# Patient Record
Sex: Female | Born: 1970 | Race: White | Hispanic: Yes | Marital: Married | Smoking: Never smoker
Health system: Southern US, Community
[De-identification: ages and names within clinical notes are randomized; demographics above are authoritative.]

## PROBLEM LIST (undated history)

## (undated) DIAGNOSIS — E0500 Thyrotoxicosis with diffuse goiter without thyrotoxic crisis or storm: Secondary | ICD-10-CM

## (undated) HISTORY — DX: Thyrotoxicosis with diffuse goiter without thyrotoxic crisis or storm: E05.00

## (undated) HISTORY — PX: NO PAST SURGERIES: SHX2092

---

## 1998-05-20 ENCOUNTER — Ambulatory Visit (HOSPITAL_COMMUNITY): Admission: RE | Admit: 1998-05-20 | Discharge: 1998-05-20 | Payer: Self-pay | Admitting: *Deleted

## 1998-10-19 ENCOUNTER — Inpatient Hospital Stay (HOSPITAL_COMMUNITY): Admission: AD | Admit: 1998-10-19 | Discharge: 1998-10-21 | Payer: Self-pay | Admitting: Obstetrics & Gynecology

## 2002-05-15 ENCOUNTER — Encounter: Payer: Self-pay | Admitting: Emergency Medicine

## 2002-05-15 ENCOUNTER — Emergency Department (HOSPITAL_COMMUNITY): Admission: EM | Admit: 2002-05-15 | Discharge: 2002-05-15 | Payer: Self-pay | Admitting: Emergency Medicine

## 2003-09-19 ENCOUNTER — Encounter: Admission: RE | Admit: 2003-09-19 | Discharge: 2003-09-19 | Payer: Self-pay | Admitting: Obstetrics and Gynecology

## 2003-09-23 ENCOUNTER — Ambulatory Visit (HOSPITAL_COMMUNITY): Admission: RE | Admit: 2003-09-23 | Discharge: 2003-09-23 | Payer: Self-pay | Admitting: Obstetrics and Gynecology

## 2007-11-22 ENCOUNTER — Ambulatory Visit: Payer: Self-pay | Admitting: Internal Medicine

## 2007-11-23 ENCOUNTER — Ambulatory Visit: Payer: Self-pay | Admitting: Family Medicine

## 2007-11-23 ENCOUNTER — Ambulatory Visit: Payer: Self-pay | Admitting: *Deleted

## 2007-11-23 LAB — CONVERTED CEMR LAB
Basophils Absolute: 0.1 10*3/uL (ref 0.0–0.1)
Basophils Relative: 1 % (ref 0–1)
Calcium: 9.1 mg/dL (ref 8.4–10.5)
Eosinophils Absolute: 0.5 10*3/uL (ref 0.0–0.7)
FSH: 2.4 milliintl units/mL
Glucose, Bld: 88 mg/dL (ref 70–99)
HDL: 50 mg/dL (ref >39)
Hemoglobin: 13.3 g/dL (ref 12.0–15.0)
Lymphocytes Relative: 35 % (ref 12–46)
Lymphs Abs: 2.7 10*3/uL (ref 0.7–4.0)
MCHC: 33.3 g/dL (ref 30.0–36.0)
MCV: 91.7 fL (ref 78.0–100.0)
Neutro Abs: 4.2 10*3/uL (ref 1.7–7.7)
Neutrophils Relative %: 53 % (ref 43–77)
Platelets: 296 10*3/uL (ref 150–400)
RDW: 14.1 % (ref 11.5–15.5)
TSH: 2.156 microintl units/mL (ref 0.350–4.50)
Total Bilirubin: 0.7 mg/dL (ref 0.3–1.2)
Total CHOL/HDL Ratio: 3.3
Total Protein: 7 g/dL (ref 6.0–8.3)
Triglycerides: 58 mg/dL (ref <150)
VLDL: 12 mg/dL (ref 0–40)
WBC: 7.9 10*3/uL (ref 4.0–10.5)

## 2007-12-11 ENCOUNTER — Encounter: Payer: Self-pay | Admitting: Family Medicine

## 2007-12-11 ENCOUNTER — Ambulatory Visit: Payer: Self-pay | Admitting: Internal Medicine

## 2007-12-11 LAB — CONVERTED CEMR LAB: Progesterone: 9.3 ng/mL

## 2007-12-20 ENCOUNTER — Ambulatory Visit: Payer: Self-pay | Admitting: Family Medicine

## 2007-12-20 ENCOUNTER — Encounter: Payer: Self-pay | Admitting: Family Medicine

## 2007-12-20 LAB — CONVERTED CEMR LAB: GC Probe Amp, Genital: NEGATIVE

## 2010-06-19 NOTE — Group Therapy Note (Signed)
NAME:  Regina Calhoun, Regina Calhoun NO.:  [id number]   MEDICAL RECORD NO.:  [id number]                   PATIENT TYPE:  OUT   LOCATION:  [facility]                           FACILITY:  [facility]   PHYSICIAN:  Argentina Donovan, MD                     DATE OF BIRTH:  [birth year]   DATE OF SERVICE:  09/19/2003                                    CLINIC NOTE   REASON FOR VISIT:  The patient is a 40 year old Hispanic female gravida 3  para 2-0-0-2 with a child age 72 and 81 with the same partner she has had  many years who desires to become pregnant and also has had a significant  problem over the past year with pelvic pain not related to her period, comes  and goes, but is also bothered by significant dyspareunia with intercourse.  Interesting family history, her mother has had surgery for urinary  incontinence and the patient has significant stress incontinence.  She has  relatively regular periods and a normal bowel movement on most days.  She is  5 feet 4 inches and weighs 152 pounds.   PHYSICAL EXAMINATION:  Her abdomen is soft, flat, nontender.  No masses, no  organomegaly.  External genitalia is normal.  BUS within normal limits.  The  vagina is clean and well rugated with a first degree cystocele and a first  degree uterine prolapse with a uterus of normal size, shape, and consistency  and a fishmouth cervix.  The adnexa could not be well outlined.  Of  significance in her past history is after her first baby she had a uterine  inversion which was done when the placenta was delivered.  They put her to  sleep and were able to reverse the problem.   On examination my impression is that the patient has dyspareunia because of  deep penetration because of her uterine prolapse and cystocele.  We have  counseled her in positional changes.  Also, we are going to get a DNA and a  wet prep to rule out any pelvic infection, an ultrasound to rule out any  significant problem we may have  missed, and given her a slip to get a sperm  analysis.  If all these things are normal, would get a hysterosalpingogram  on this patient and eventually do a laparoscopy.  I have told her it would  not be wise until she has completed her family to go through corrective  surgery for the stress incontinence because it is not a major problem in her  life at this point, and there is significant surgery entailed in correcting  that and she is best off eventually having a hysterectomy when it is  desired.   IMPRESSION:  1. Secondary infertility.  2. Symptomatic cystocele with first degree uterine prolapse and dyspareunia.  Argentina Donovan, MD    PR/MEDQ  D:  09/19/2003  T:  09/19/2003  Job:  [id number]

## 2012-02-26 ENCOUNTER — Ambulatory Visit: Payer: Self-pay | Admitting: Physician Assistant

## 2012-02-26 VITALS — BP 111/69 | HR 77 | Temp 98.0°F | Resp 16 | Ht 66.0 in | Wt 173.6 lb

## 2012-02-26 DIAGNOSIS — L299 Pruritus, unspecified: Secondary | ICD-10-CM

## 2012-02-26 MED ORDER — CETIRIZINE HCL 10 MG PO TABS: 10.0000 mg | ORAL_TABLET | Freq: Every day | ORAL | Status: DC | PRN

## 2012-02-26 MED ORDER — RANITIDINE HCL 300 MG PO TABS: 300.0000 mg | ORAL_TABLET | Freq: Every evening | ORAL | Status: DC | PRN

## 2012-02-26 NOTE — Progress Notes (Signed)
   [address] [place] [address]   Phone [phone]  Subjective:    Patient ID: Regina Calhoun, female    DOB: 1970/05/08, 42 y.o.   MRN: [id number]  HPI Pt presents to clinic with overall itching that started yesterday afternoon and has just gotten worse.  She has been exposed to nothing new and has taken no medications for the itching.  Her daughter brings up that she also has some leg pain and chest pain but the patient does not seem interested in those things being evaluated.  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  She has had for months and does not take anything for the pain.  She has a second of chest pain this am not related to anything and is not worried about it currently, no problems with heartburn, SOB or wheezing.  No h/o heart problems personal or in her family.  She does not feel like she has to burp more than normal.  Review of Systems  Constitutional: Negative for fever and chills.  Respiratory: Negative for cough and shortness of breath.   Cardiovascular: Positive for chest pain (a few seconds this am - did not happen again ). Negative for leg swelling.  Musculoskeletal: Positive for myalgias (bilateral lower legs).  Skin: Negative for rash.       Itchy without any rash       Objective:   Physical Exam  Vitals reviewed. Constitutional: She is oriented to person, place, and time. She appears well-developed and well-nourished.  HENT:  Head: Normocephalic and atraumatic.  Right Ear: External ear normal.  Left Ear: External ear normal.  Eyes: Conjunctivae normal are normal.  Cardiovascular: Normal rate, regular rhythm and normal heart sounds.   Pulmonary/Chest: Effort normal and breath sounds normal.       No pain with chest wall palpation  Abdominal: Soft. There is no tenderness.  Musculoskeletal:       No pain with calf palpation, neg homans bilaterally  Neurological: She  is alert and oriented to person, place, and time.  Skin: Skin is warm and dry. No rash noted. No erythema.       Pt scratching the entire visit.  No rash but does have areas of excoriations and petechiae where she states that she itches the most.  Psychiatric: She has a normal mood and affect. Her behavior is normal. Judgment and thought content normal.        Assessment & Plan:   1. Itching  ranitidine (ZANTAC) 300 MG tablet, cetirizine (ZYRTEC) 10 MG tablet   Unsure cause of itching but seems to be a release of histamine so will treat with blockers and pt to use benadryl tonight to help her sleep.  I think that her leg pain is from standing all day due to length of time and neg exam - pt will try OTC NSAIDs and if it gets worse pt will RTC but I think that it will not.  Pt to f/u is develops rash and to the ED is any breathing problems due to [place] not knowing what is causing her itching.  She should avoid hot showers and use moisturizer to help with dry skin.  Pt understands and agrees with the above.

## 2012-04-20 ENCOUNTER — Other Ambulatory Visit (HOSPITAL_COMMUNITY): Payer: Self-pay | Admitting: Internal Medicine

## 2012-04-20 DIAGNOSIS — Z1231 Encounter for screening mammogram for malignant neoplasm of breast: Secondary | ICD-10-CM

## 2012-04-25 ENCOUNTER — Ambulatory Visit (HOSPITAL_COMMUNITY)
Admission: RE | Admit: 2012-04-25 | Discharge: 2012-04-25 | Disposition: A | Discharge status: Home / Self Care | Payer: Self-pay | Source: Ambulatory Visit | Attending: Internal Medicine | Admitting: Internal Medicine

## 2012-04-25 DIAGNOSIS — Z1231 Encounter for screening mammogram for malignant neoplasm of breast: Secondary | ICD-10-CM | POA: Insufficient documentation

## 2012-04-27 ENCOUNTER — Other Ambulatory Visit: Payer: Self-pay | Admitting: Internal Medicine

## 2012-04-27 DIAGNOSIS — R928 Other abnormal and inconclusive findings on diagnostic imaging of breast: Secondary | ICD-10-CM

## 2012-05-12 ENCOUNTER — Other Ambulatory Visit: Payer: Self-pay | Admitting: *Deleted

## 2012-05-12 DIAGNOSIS — N6489 Other specified disorders of breast: Secondary | ICD-10-CM

## 2012-05-16 ENCOUNTER — Encounter (HOSPITAL_COMMUNITY): Payer: Self-pay

## 2012-05-16 ENCOUNTER — Ambulatory Visit (HOSPITAL_COMMUNITY)
Admission: RE | Admit: 2012-05-16 | Discharge: 2012-05-16 | Disposition: A | Discharge status: Home / Self Care | Payer: Self-pay | Source: Ambulatory Visit | Attending: Obstetrics and Gynecology | Admitting: Obstetrics and Gynecology

## 2012-05-16 VITALS — BP 110/70 | Temp 98.8°F | Ht 62.5 in | Wt 170.2 lb

## 2012-05-16 DIAGNOSIS — N644 Mastodynia: Secondary | ICD-10-CM | POA: Insufficient documentation

## 2012-05-16 DIAGNOSIS — Z01419 Encounter for gynecological examination (general) (routine) without abnormal findings: Secondary | ICD-10-CM

## 2012-05-16 NOTE — Progress Notes (Signed)
Referred to [facility] by the [facility] due to needing additional imaging of her right breast. Screening mammogram completed 3/22014 at [facility]. Complaints of bilateral outer breast tenderness that is greater in the right breast. Pain rates pain at a 2 out of 10. Patient stated the pain is dull with occasional pulsating in the right breast.  Pap Smear:  Completed Pap smear today. Per patient last Pap smear was 2 years ago in [place]. Per patient no history of an abnormal Pap smear. Pap smear 12/20/2007 is in [facility].Marland Kitchen  Physical exam: Breasts Breasts symmetrical. No skin abnormalities bilateral breasts. No nipple retraction bilateral breasts. No nipple discharge bilateral breasts. No lymphadenopathy. No lumps palpated bilateral breasts. Patient complained of bilateral outer breast tenderness on exam. Counseled patient on decreasing caffeine intake. Referred patient to the [facility] for right breast diagnostic mammogram and possible ultrasound per recommendation. Appointment scheduled for Tuesday, May 30, 2012 at 0940.         Pelvic/Bimanual   Ext Genitalia No lesions, no swelling and no discharge observed on external genitalia.         Vagina Vagina pink and normal texture. No lesions or discharge observed in vagina.          Cervix Cervix is present. Cervix pink and of normal texture. Cervix friable. No discharge observed on cervix.    Uterus Uterus is present and palpable. Uterus in normal position and normal size. Patient complained of tenderness on palpation. Referred patient to the [facility] due to patient complaining of constant lower abdominal tenderness. Appointment scheduled for Thursday, Jun 15, 2012 at 1430.       Adnexae Bilateral ovaries present and palpable. No tenderness on palpation.        Rectovaginal No rectal exam completed today since patient had no rectal complaints. No skin abnormalities observed on  exam.

## 2012-05-16 NOTE — Patient Instructions (Signed)
Taught patient how to perform BSE. Let her know [facility] will cover Pap smears every 3 years unless has a history of abnormal Pap smears. Referred patient to the [facility] for lower abdominal pain and tenderness. Appointment scheduled for Thursday, Jun 15, 2012 at 1430. Let patient know there is a $20.00 co-pay and that she completed the financial assistance paperwork today. Referred patient to the [facility] for right breast diagnostic mammogram and possible ultrasound per recommendation. Appointment scheduled for Tuesday, May 30, 2012 at 0940. Patient aware of appointments and will be there. Let patient know will follow up with her within the next couple weeks with results by letter or phone. Patient verbalized understanding.

## 2012-05-22 ENCOUNTER — Encounter (HOSPITAL_COMMUNITY): Payer: Self-pay | Admitting: *Deleted

## 2012-05-23 ENCOUNTER — Ambulatory Visit (HOSPITAL_COMMUNITY): Payer: Self-pay

## 2012-05-24 ENCOUNTER — Other Ambulatory Visit: Payer: Self-pay

## 2012-05-30 ENCOUNTER — Other Ambulatory Visit: Payer: Self-pay

## 2012-05-30 ENCOUNTER — Ambulatory Visit
Admission: RE | Admit: 2012-05-30 | Discharge: 2012-05-30 | Disposition: A | Discharge status: Home / Self Care | Payer: No Typology Code available for payment source | Source: Ambulatory Visit | Attending: Obstetrics and Gynecology | Admitting: Obstetrics and Gynecology

## 2012-05-30 DIAGNOSIS — N6489 Other specified disorders of breast: Secondary | ICD-10-CM

## 2012-06-15 ENCOUNTER — Encounter: Payer: Self-pay | Admitting: Medical

## 2013-06-12 ENCOUNTER — Ambulatory Visit: Payer: No Typology Code available for payment source

## 2013-11-06 ENCOUNTER — Ambulatory Visit: Payer: No Typology Code available for payment source | Attending: Internal Medicine

## 2013-11-15 ENCOUNTER — Encounter: Payer: Self-pay | Admitting: Internal Medicine

## 2013-11-15 ENCOUNTER — Ambulatory Visit: Payer: No Typology Code available for payment source | Attending: Internal Medicine | Admitting: Internal Medicine

## 2013-11-15 VITALS — BP 109/71 | HR 68 | Temp 97.5°F | Resp 20 | Ht 63.5 in | Wt 158.4 lb

## 2013-11-15 DIAGNOSIS — Z23 Encounter for immunization: Secondary | ICD-10-CM | POA: Insufficient documentation

## 2013-11-15 DIAGNOSIS — N912 Amenorrhea, unspecified: Secondary | ICD-10-CM | POA: Insufficient documentation

## 2013-11-15 DIAGNOSIS — N644 Mastodynia: Secondary | ICD-10-CM | POA: Insufficient documentation

## 2013-11-15 LAB — CBC
HEMATOCRIT: 39.7 % (ref 36.0–46.0)
HEMOGLOBIN: 13.8 g/dL (ref 12.0–15.0)
MCH: 28 pg (ref 26.0–34.0)
MCHC: 34.8 g/dL (ref 30.0–36.0)
MCV: 80.5 fL (ref 78.0–100.0)
Platelets: 323 10*3/uL (ref 150–400)
RBC: 4.93 MIL/uL (ref 3.87–5.11)
RDW: 13.9 % (ref 11.5–15.5)
WBC: 11.5 10*3/uL — AB (ref 4.0–10.5)

## 2013-11-15 LAB — COMPLETE METABOLIC PANEL WITH GFR
ALBUMIN: 4 g/dL (ref 3.5–5.2)
ALK PHOS: 100 U/L (ref 39–117)
ALT: 28 U/L (ref 0–35)
AST: 26 U/L (ref 0–37)
BILIRUBIN TOTAL: 0.5 mg/dL (ref 0.2–1.2)
BUN: 9 mg/dL (ref 6–23)
CALCIUM: 9.6 mg/dL (ref 8.4–10.5)
CO2: 23 mEq/L (ref 19–32)
CREATININE: 0.54 mg/dL (ref 0.50–1.10)
Chloride: 103 mEq/L (ref 96–112)
Glucose, Bld: 99 mg/dL (ref 70–99)
POTASSIUM: 4.2 meq/L (ref 3.5–5.3)
Sodium: 138 mEq/L (ref 135–145)
TOTAL PROTEIN: 6.9 g/dL (ref 6.0–8.3)

## 2013-11-15 LAB — POCT URINE PREGNANCY: Preg Test, Ur: NEGATIVE

## 2013-11-15 LAB — TSH: TSH: 0.008 u[IU]/mL — ABNORMAL LOW (ref 0.350–4.500)

## 2013-11-15 NOTE — Progress Notes (Signed)
Patient presents to establish care C/O abdomen "growing" for 2 months LMP 10/28/13 States she has taken home pregnancy test that was negative Menses in Aug 2015 only lasted 2 days; usually lasts 4 days

## 2013-11-15 NOTE — Progress Notes (Signed)
Patient ID: Regina CritchleyGabriela R Calhoun, female   DOB: 1970/12/01, 43 y.o.   MRN: [id number]   [id number]  [id number]:[id number]  DOB - 1970/12/01  CC:  Chief Complaint  Patient presents with  . Establish Care       HPI: Regina Calhoun is a 43 y.o. female here today to establish medical care.  Patient presents to clinic today with concerns of a growing abdomen over the past two months.  She reports that her last menustral period was 9/27 but was only for two days.  She notes some abdominal cramping and swelling that makes her abdomen feels hard.  She notes the distension after meals.  Denies gas or constipation.  Some breast tenderness.  Last pap smear was last year and was normal.   No Known Allergies History reviewed. No pertinent past medical history. Current Outpatient Prescriptions on File Prior to Visit  Medication Sig Dispense Refill  . multivitamin-iron-minerals-folic acid (CENTRUM) chewable tablet Chew 1 tablet by mouth daily.      . cetirizine (ZYRTEC) 10 MG tablet Take 1 tablet (10 mg total) by mouth daily as needed (itching).  15 tablet  0  . ranitidine (ZANTAC) 300 MG tablet Take 1 tablet (300 mg total) by mouth at bedtime as needed (itching).  15 tablet  0   No current facility-administered medications on file prior to visit.   Family History  Problem Relation Age of Onset  . Polycystic ovary syndrome Daughter   . Clotting disorder Paternal Grandmother   . Hypothyroidism Mother    History   Social History  . Marital Status: Married    Spouse Name: N/A    Number of Children: N/A  . Years of Education: N/A   Occupational History  . Not on file.   Social History Main Topics  . Smoking status: Never Smoker   . Smokeless tobacco: Not on file  . Alcohol Use: No  . Drug Use: No  . Sexual Activity: Not Currently   Other Topics Concern  . Not on file   Social History Narrative  . No narrative on file    Review of Systems: See HPI.    Objective:   Filed Vitals:   11/15/13 1419  BP: 109/71  Pulse: 68  Temp: 97.5 F (36.4 C)  Resp: 20    Physical Exam: Constitutional: Patient appears well-developed and well-nourished. No distress. HENT: Normocephalic, atraumatic, External right and left ear normal. Oropharynx is clear and moist.  Eyes: Conjunctivae and EOM are normal. PERRLA, no scleral icterus. Neck: Normal ROM. Neck supple. No JVD. No tracheal deviation. No thyromegaly. CVS: RRR, S1/S2 +, no murmurs, no gallops, no carotid bruit.  Pulmonary: Effort and breath sounds normal, no stridor, rhonchi, wheezes, rales.  Abdominal: Soft. BS +, no distension, tenderness, rebound or guarding.  Musculoskeletal: Normal range of motion. No edema and no tenderness.  Lymphadenopathy: No lymphadenopathy noted, cervical, Neuro: Alert. Normal reflexes, muscle tone coordination. No cranial nerve deficit. Skin: Skin is warm and dry. No rash noted. Not diaphoretic. No erythema. No pallor. Psychiatric: Normal mood and affect. Behavior, judgment, thought content normal.  Lab Results  Component Value Date   WBC 7.9 11/23/2007   HGB 13.3 11/23/2007   HCT 39.9 11/23/2007   MCV 91.7 11/23/2007   PLT 296 11/23/2007   Lab Results  Component Value Date   CREATININE 0.55 11/23/2007   BUN 8 11/23/2007   NA 140 11/23/2007   K 4.3 11/23/2007   CL 108 11/23/2007   CO2 23  11/23/2007    No results found for this basename: HGBA1C   Lipid Panel     Component Value Date/Time   CHOL 166 11/23/2007 2101   TRIG 58 11/23/2007 2101   HDL 50 11/23/2007 2101   CHOLHDL 3.3 Ratio 11/23/2007 2101   VLDL 12 11/23/2007 2101   LDLCALC 104* 11/23/2007 2101       Assessment and plan:   Regina Calhoun was seen today for establish care.  Diagnoses and associated orders for this visit:  Amenorrhea - POCT urine pregnancy Distended abdomen unnoticeable to me  Breast tenderness - TSH - CBC - COMPLETE METABOLIC PANEL WITH GFR  Need for prophylactic vaccination and inoculation  against influenza - Flu Vaccine QUAD 36+ mos PF IM (Fluarix Quad PF)   Due to language barrier, an interpreter was present during the history-taking and subsequent discussion (and for part of the physical exam) with this patient.   Return if symptoms worsen or fail to improve.  The patient was given clear instructions to go to ER or return to medical center if symptoms don't improve, worsen or new problems develop. The patient verbalized understanding.   Holland CommonsKECK, VALERIE, NP-C [facility] [phone] 11/18/2013, 11:22 PM

## 2013-11-26 ENCOUNTER — Telehealth: Payer: Self-pay | Admitting: Internal Medicine

## 2013-11-26 ENCOUNTER — Telehealth: Payer: Self-pay | Admitting: *Deleted

## 2013-11-26 NOTE — Telephone Encounter (Signed)
Pt. Came into facility requesting blood work results. Pt. States that she is not feeling well. Please f/u with pt.

## 2013-11-26 NOTE — Telephone Encounter (Signed)
Message copied by Dyann KiefGIRALDEZ, Trapper Meech M on Mon Nov 26, 2013  2:56 PM ------      Message from: Holland CommonsKECK, VALERIE A      Created: Wed Nov 21, 2013  6:34 PM       Please call patient and let her know her thyroid level was reading too low. Please have patient come back in 2 weeks for repeat TSH and free T4. Find out if patient doesn't have any symptoms relating to overactive thyroid ------

## 2013-11-26 NOTE — Telephone Encounter (Signed)
Pt aware of lab results, call transfered to front office to schedule lab appointment

## 2013-11-30 ENCOUNTER — Ambulatory Visit: Payer: No Typology Code available for payment source | Attending: Internal Medicine

## 2013-11-30 DIAGNOSIS — R7989 Other specified abnormal findings of blood chemistry: Secondary | ICD-10-CM

## 2013-12-01 LAB — T4, FREE: Free T4: 2.01 ng/dL — ABNORMAL HIGH (ref 0.80–1.80)

## 2013-12-01 LAB — TSH: TSH: 0.01 u[IU]/mL — ABNORMAL LOW (ref 0.350–4.500)

## 2013-12-03 ENCOUNTER — Encounter: Payer: Self-pay | Admitting: Internal Medicine

## 2013-12-05 ENCOUNTER — Telehealth: Payer: Self-pay | Admitting: Emergency Medicine

## 2013-12-05 NOTE — Telephone Encounter (Signed)
-----   Message from Ambrose FinlandValerie A Keck, NP sent at 12/04/2013  9:29 PM EST ----- Patient has overactive thyroid. Please find out if patient is having symptoms and send referral to endocrinology please

## 2013-12-12 ENCOUNTER — Telehealth: Payer: Self-pay | Admitting: *Deleted

## 2013-12-12 NOTE — Telephone Encounter (Signed)
-----   Message from Valerie A Keck, NP sent at 12/04/2013  9:29 PM EST ----- Patient has overactive thyroid. Please find out if patient is having symptoms and send referral to endocrinology please 

## 2013-12-12 NOTE — Telephone Encounter (Signed)
Unable to contact Patient, no answer machine

## 2013-12-21 ENCOUNTER — Encounter: Payer: Self-pay | Admitting: *Deleted

## 2013-12-21 DIAGNOSIS — E079 Disorder of thyroid, unspecified: Secondary | ICD-10-CM

## 2014-01-17 ENCOUNTER — Ambulatory Visit (INDEPENDENT_AMBULATORY_CARE_PROVIDER_SITE_OTHER): Payer: Self-pay | Admitting: Internal Medicine

## 2014-01-17 ENCOUNTER — Encounter: Payer: Self-pay | Admitting: Internal Medicine

## 2014-01-17 VITALS — BP 104/62 | HR 77 | Temp 98.6°F | Resp 12 | Ht 63.0 in | Wt 165.6 lb

## 2014-01-17 DIAGNOSIS — E0590 Thyrotoxicosis, unspecified without thyrotoxic crisis or storm: Secondary | ICD-10-CM

## 2014-01-17 NOTE — Progress Notes (Signed)
Patient ID: Regina Calhoun, female   DOB: November 27, 1970, 43 y.o.   MRN: [id number]   HPI  Regina Calhoun is a 43 y.o.-year-old female, referred by her PCP, Dr. Luna GlasgowKeck, for evaluation for thyrotoxicosis. Patient is seen with the help of the Spanish interpreter.  She does not have insurance (has [employer] Card)!  She was found to have overactive thyroid at last visit with PCP. They were checked b/c she c/o:  - bloating - irregular menses - skipping 1 mo starting in Aug >> she thought she was pregnant >> UPT neg.  I reviewed pt's thyroid tests: Lab Results  Component Value Date   TSH 0.010* 11/30/2013   TSH 0.008* 11/15/2013   TSH 2.156 11/23/2007   FREET4 2.01* 11/30/2013    Pt denies feeling nodules in neck, hoarseness, dysphagia/odynophagia, SOB with lying down; she c/o: - no fatigue - no excessive sweating/heat intolerance - no tremors - no anxiety - no palpitations - no hyperdefecation - no weight loss; + weight gain 4 lbs in 2 mo - + hair loss - + chest pain - stabbing, not related to I/E  Pt have a FH of thyroid ds.: mother, sister, brother. No FH of thyroid cancer. No h/o radiation tx to head or neck.  No seaweed or kelp, no recent contrast studies. No steroid use. No herbal supplements.  ROS: Constitutional: see HPI Eyes:+ blurry vision, no xerophthalmia ENT: no sore throat, no nodules palpated in throat, no dysphagia/odynophagia, no hoarseness Cardiovascular: + CP/no SOB/palpitations/+ leg swelling Respiratory: no cough/SOB Gastrointestinal: no N/V/D/C Musculoskeletal: no muscle/joint aches Skin: no rashes, + hair loss, + easy bruising Neurological: no tremors/numbness/tingling/dizziness Psychiatric: no depression/anxiety  No past medical history - other than described above.  No past surgical history.   History   Social History  . Marital Status: Married    Spouse Name: N/A    Number of Children: 2   Occupational History  . cleaning   Social History  Main Topics  . Smoking status: Never Smoker   . Smokeless tobacco: Not on file  . Alcohol Use: No  . Drug Use: No   Current Outpatient Prescriptions on File Prior to Visit  Medication Sig Dispense Refill  . cetirizine (ZYRTEC) 10 MG tablet Take 1 tablet (10 mg total) by mouth daily as needed (itching). (Patient not taking: Reported on 01/17/2014) 15 tablet 0  . multivitamin-iron-minerals-folic acid (CENTRUM) chewable tablet Chew 1 tablet by mouth daily.    . ranitidine (ZANTAC) 300 MG tablet Take 1 tablet (300 mg total) by mouth at bedtime as needed (itching). (Patient not taking: Reported on 01/17/2014) 15 tablet 0   No current facility-administered medications on file prior to visit.   No Known Allergies Family History  Problem Relation Age of Onset  . Polycystic ovary syndrome Daughter   . Clotting disorder Paternal Grandmother   . Hypothyroidism Mother    PE: BP 104/62 mmHg  Pulse 77  Temp(Src) 98.6 F (37 C) (Oral)  Resp 12  Ht 5\' 3"  (1.6 m)  Wt 165 lb 9.6 oz (75.116 kg)  BMI 29.34 kg/m2  SpO2 97% Wt Readings from Last 3 Encounters:  01/17/14 165 lb 9.6 oz (75.116 kg)  11/15/13 158 lb 6.4 oz (71.85 kg)  05/16/12 170 lb 3.2 oz (77.202 kg)   Constitutional: overweight, in NAD Eyes: PERRLA, EOMI, no exophthalmos, no lid lag, no stare ENT: moist mucous membranes, no thyromegaly, no thyroid bruits, no cervical lymphadenopathy Cardiovascular: RRR, No MRG Respiratory: CTA B Gastrointestinal:  abdomen soft, NT, ND, BS+ Musculoskeletal: no deformities, strength intact in all 4 Skin: moist, warm, no rashes Neurological: Fine tremor with outstretched hands, DTR normal in all 4  ASSESSMENT: 1. Thyrotoxicosis  PLAN:  1. Patient with a recently found low TSH, without thyrotoxic sxs or symptoms: weight loss, heat intolerance, hyperdefecation, palpitations, anxiety, tachycardia, but with mild tremor with outstretched hands on physical exam.  - she does not appear to have  exogenous causes for the low TSH.  - We discussed that possible causes of thyrotoxicosis are:  Graves ds   Thyroiditis toxic multinodular goiter/ toxic adenoma (I cannot feel nodules at palpation of her thyroid). - I suggested that we check the TSH, fT3 and fT4 and also thyroid stimulating antibodies to screen for Graves' disease.  - If the tests remain abnormal, we may need an uptake and scan to differentiate between the 3 above possible etiologies. However, the fact that she does not have insurance will limit our capability to perform this test. If the TSI antibodies are elevated, I will assume that she has Graves ds. and will skip the uptake and scan for now - we discussed about possible modalities of treatment for the above conditions, to include methimazole use, radioactive iodine ablation or (last resort) surgery. As mentioned above, since she does not have insurance, it is going to be difficult to perform radioactive iodine ablation, so we'll try to manage this with methimazole if it turns out to be Graves' disease. Alternatively, if this is subacute thyroiditis, we might not need to treat her but just follow her clinically and follow her thyroid tests over time. - I do not feel that we need to add beta blockers at this time, since she is not tachycardic, anxious, or tremulous - RTC in 3 months, but likely sooner for repeat labs  Per patient's preference, communication should be through the Spanish interpreter line.  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 >> Will start low dose MMI: 5 mg bid >> recheck TFTs in 1 mo. Will advise her of possible SEs.

## 2014-01-17 NOTE — Patient Instructions (Signed)
Please stop at the lab.  Please come back for a follow-up appointment in 3 months.  Hipertiroidismo (Hyperthyroidism) La tiroides es una glndula grande ubicada en la parte anterior e inferior del cuello. La tiroides interviene PepsiCoen el control del metabolismo. El metabolismo es el modo en que el organismo utiliza los alimentos. El control del metabolismo se realiza a travs de una hormona denominada tiroxina. Cuando la tiroides es hiperactiva, produce demasiada hormona. Cuando esto ocurre pueden surgir los siguientes problemas:   Nerviosismo  Intolerancia al calor  Prdida de peso (a pesar del aumento de la ingesta de comida)  Diarrea  Cambios en la textura del cabello o de la piel  Palpitaciones (falta de algunos latidos cardacos o latidos extra)  Taquicardia (frecuencia cardaca acelerada)  Falta de menstruacin (amenorrea)  Temblor en las manos CAUSAS  Enfermedad de Graves (el sistema inmune ataca a la glndula tiroides). sta es la causa ms frecuente.  Inflamacin de la glndula tiroides.  Tumor (generalmente benigno) de la glndula tiroides o Scientist, water qualityubicado en otro lugar.  Uso excesivo de medicamentos para la tiroides (tanto prescriptos como 'naturales')  Ingesta excesiva de iodo. DIAGNSTICO Para confirmar el hipertiroidismo, el profesional que lo asiste le solicitar anlisis de sangre y estudios con Tomalesultrasonido. Algunas veces los signos estn ocultos. Puede ser BB&T Corporationnecesario que el profesional controle la enfermedad con exmenes de [place]re, ya sea antes o despus del diagnstico y Glasgowtratamiento. TRATAMIENTO Tratamiento a Web designercorto plazo Existen varios tratamientos para Risk managercontrolar los sntomas. Los medicamentos beta bloqueantes podrn proporcionar cierto alivio. Los medicamentos que disminuyen la produccin de hormonas proporcionarn a Neurosurgeonmuchas personas un alivio temporal Estas medidas en general no ofrecen Architectuna mejora permanente. Tratamiento definitivo  Se dispone de varios tratamientos  que el profesional que lo asiste podr Agricultural engineercomentar con usted y que tratarn el problema de [place]era permanente. Estos tratamientos varan desde la ciruga (extirpacin de la tiroides) o el uso de yodo radiactivo (que destruye la tiroides por radiacin), hasta el uso de medicamentos antitiroideos (que interfieren en la sntesis de la hormona). Los dos primeros tratamientos son permanentes y Soil scientistgeneralmente exitosos. Habitualmente requieren el suministro de hormona de por vida. Esto es debido a que es imposible retirar o destruir la cantidad Secondary school teacherexacta de tiroides que se necesita para que la persona quede eutiroidea (normal). INSTRUCCIONES PARA EL CUIDADO DOMICILIARIO Consulte con el profesional que lo asiste si el problema por el que lo trata empeora. Algunos ejemplos seran los trastornos ya mencionados. SOLICITE ATENCIN MDICA SI: El trastorno general empeora. EST SEGURO QUE:   Comprende las instrucciones para el alta mdica.  Controlar su enfermedad.  Solicitar atencin mdica de inmediato segn las indicaciones. Document Released: 01/18/2005 Document Revised: 04/12/2011 [facility] Patient Information 2015 [employer], [place]. This information is not intended to replace advice given to you by your health care provider. Make sure you discuss any questions you have with your health care provider.

## 2014-01-21 ENCOUNTER — Other Ambulatory Visit: Payer: No Typology Code available for payment source

## 2014-01-22 LAB — T3, FREE: T3 FREE: 5.3 pg/mL — AB (ref 2.3–4.2)

## 2014-01-22 LAB — TSH: TSH: 0.008 u[IU]/mL — ABNORMAL LOW (ref 0.350–4.500)

## 2014-01-22 LAB — T4, FREE: FREE T4: 2.04 ng/dL — AB (ref 0.80–1.80)

## 2014-01-23 LAB — THYROID STIMULATING IMMUNOGLOBULIN: TSI: 341 % baseline — ABNORMAL HIGH (ref <140)

## 2014-01-23 MED ORDER — METHIMAZOLE 5 MG PO TABS: 5.0000 mg | ORAL_TABLET | Freq: Two times a day (BID) | ORAL | Status: DC

## 2014-02-26 ENCOUNTER — Other Ambulatory Visit (INDEPENDENT_AMBULATORY_CARE_PROVIDER_SITE_OTHER): Payer: Self-pay

## 2014-02-26 DIAGNOSIS — E0590 Thyrotoxicosis, unspecified without thyrotoxic crisis or storm: Secondary | ICD-10-CM

## 2014-02-26 LAB — T3, FREE: T3 FREE: 3 pg/mL (ref 2.3–4.2)

## 2014-02-26 LAB — TSH: TSH: 0.09 u[IU]/mL — AB (ref 0.35–4.50)

## 2014-02-26 LAB — T4, FREE: Free T4: 0.84 ng/dL (ref 0.60–1.60)

## 2014-05-02 ENCOUNTER — Encounter: Payer: Self-pay | Admitting: Internal Medicine

## 2014-05-02 ENCOUNTER — Ambulatory Visit: Payer: Self-pay | Attending: Internal Medicine | Admitting: Internal Medicine

## 2014-05-02 VITALS — BP 107/71 | HR 57 | Temp 97.9°F | Resp 16 | Ht 62.0 in | Wt 170.0 lb

## 2014-05-02 DIAGNOSIS — E0590 Thyrotoxicosis, unspecified without thyrotoxic crisis or storm: Secondary | ICD-10-CM | POA: Insufficient documentation

## 2014-05-02 DIAGNOSIS — E079 Disorder of thyroid, unspecified: Secondary | ICD-10-CM

## 2014-05-02 DIAGNOSIS — R350 Frequency of micturition: Secondary | ICD-10-CM | POA: Insufficient documentation

## 2014-05-02 DIAGNOSIS — N3000 Acute cystitis without hematuria: Secondary | ICD-10-CM | POA: Insufficient documentation

## 2014-05-02 LAB — POCT URINALYSIS DIPSTICK
Bilirubin, UA: NEGATIVE
GLUCOSE UA: NEGATIVE
KETONES UA: NEGATIVE
NITRITE UA: NEGATIVE
Protein, UA: NEGATIVE
RBC UA: NEGATIVE
Spec Grav, UA: 1.015
Urobilinogen, UA: 0.2
pH, UA: 7

## 2014-05-02 MED ORDER — CIPROFLOXACIN HCL 500 MG PO TABS: 500.0000 mg | ORAL_TABLET | Freq: Two times a day (BID) | ORAL | Status: DC

## 2014-05-02 MED ORDER — METHIMAZOLE 5 MG PO TABS: 5.0000 mg | ORAL_TABLET | Freq: Two times a day (BID) | ORAL | Status: DC

## 2014-05-02 NOTE — Patient Instructions (Signed)
Infección urinaria  °(Urinary Tract Infection) ° La infección urinaria puede ocurrir en cualquier lugar del tracto urinario. El tracto urinario es un sistema de drenaje del cuerpo por el que se eliminan los desechos y el exceso de agua. El tracto urinario está formado por dos riñones, dos uréteres, la vejiga y la uretra. Los riñones son órganos que tienen forma de frijol. Cada riñón tiene aproximadamente el tamaño del puño. Están situados debajo de las costillas, uno a cada lado de la columna vertebral °CAUSAS  °La causa de la infección son los microbios, que son organismos microscópicos, que incluyen hongos, virus, y bacterias. Estos organismos son tan pequeños que sólo pueden verse a través del microscopio. Las bacterias son los microorganismos que más comúnmente causan infecciones urinarias.  °SÍNTOMAS  °Los síntomas pueden variar según la edad y el sexo del paciente y por la ubicación de la infección. Los síntomas en las mujeres jóvenes incluyen la necesidad frecuente e intensa de orinar y una sensación dolorosa de ardor en la vejiga o en la uretra durante la micción. Las mujeres y los hombres mayores podrán sentir cansancio, temblores y debilidad y sentir dolores musculares y dolor abdominal. Si tiene fiebre, puede significar que la infección está en los riñones. Otros síntomas son dolor en la espalda o en los lados debajo de las costillas, náuseas y vómitos.  °DIAGNÓSTICO  °Para diagnosticar una infección urinaria, el médico le preguntará acerca de sus síntomas. También le solicitará una muestra de orina. La muestra de orina se analiza para detectar bacterias y glóbulos blancos de la sangre. Los glóbulos blancos se forman en el organismo para ayudar a combatir las infecciones.  °TRATAMIENTO  °Por lo general, las infecciones urinarias pueden tratarse con medicamentos. Debido a que la mayoría de las infecciones son causadas por bacterias, por lo general pueden tratarse con antibióticos. La elección del  antibiótico y la duración del tratamiento dependerá de sus síntomas y el tipo de bacteria causante de la infección.  °INSTRUCCIONES PARA EL CUIDADO EN EL HOGAR  °· Si le recetaron antibióticos, tómelos exactamente como su médico le indique. Termine el medicamento aunque se sienta mejor después de haber tomado sólo algunos. °· Beba gran cantidad de líquido para mantener la orina de tono claro o color amarillo pálido. °· Evite la cafeína, el té y las bebidas gaseosas. Estas sustancias irritan la vejiga. °· Vaciar la vejiga con frecuencia. Evite retener la orina durante largos períodos. °· Vacíe la vejiga antes y después de tener relaciones sexuales. °· Después de mover el intestino, las mujeres deben higienizarse la región perineal desde adelante hacia atrás. Use sólo un papel tissue por vez. °SOLICITE ATENCIÓN MÉDICA SI:  °· Siente dolor en la espalda. °· Le sube la fiebre. °· Los síntomas no mejoran luego de 3 días. °SOLICITE ATENCIÓN MÉDICA DE INMEDIATO SI:  °· Siente dolor intenso en la espalda o en la zona inferior del abdomen. °· Comienza a sentir escalofríos. °· Tiene náuseas o vómitos. °· Tiene una sensación continua de quemazón o molestias al orinar. °ASEGÚRESE DE QUE:  °· Comprende estas instrucciones. °· Controlará su enfermedad. °· Solicitará ayuda de inmediato si no mejora o empeora. °Document Released: 10/28/2004 Document Revised: 10/13/2011 °Exit[employer]® Patient Information ©2015 [employer]. This information is not intended to replace advice given to you by your health care provider. Make sure you discuss any questions you have with your health care provider. ° °

## 2014-05-02 NOTE — Progress Notes (Signed)
Patient ID: Regina CritchleyGabriela R Calhoun, female   DOB: 01/25/1971, 44 y.o.   MRN: [id number]  CC: frequent urination, thyroid  HPI: Regina Calhoun is a 44 y.o. female here today for a follow up visit.  Patient has past medical history of thyroid disease. She reports that she was unsure if she was suppose to continue care with the endocrinologist. She has been out of her tapazole for 2 days.  She has been having lower abdominal pain and back pain for the past 1 month. She denies dysuria, urinary frequency, fever, chills, of hematuria.  She also denies any vaginal discharge, itch, or odor.  LMP was 04/07/14.    No Known Allergies History reviewed. No pertinent past medical history. Current Outpatient Prescriptions on File Prior to Visit  Medication Sig Dispense Refill  . methimazole (TAPAZOLE) 5 MG tablet Take 1 tablet (5 mg total) by mouth 2 (two) times daily. 60 tablet 2  . cetirizine (ZYRTEC) 10 MG tablet Take 1 tablet (10 mg total) by mouth daily as needed (itching). (Patient not taking: Reported on 01/17/2014) 15 tablet 0  . multivitamin-iron-minerals-folic acid (CENTRUM) chewable tablet Chew 1 tablet by mouth daily.    . ranitidine (ZANTAC) 300 MG tablet Take 1 tablet (300 mg total) by mouth at bedtime as needed (itching). (Patient not taking: Reported on 01/17/2014) 15 tablet 0   No current facility-administered medications on file prior to visit.   Family History  Problem Relation Age of Onset  . Polycystic ovary syndrome Daughter   . Clotting disorder Paternal Grandmother   . Hypothyroidism Mother    History   Social History  . Marital Status: Married    Spouse Name: N/A  . Number of Children: N/A  . Years of Education: N/A   Occupational History  . Not on file.   Social History Main Topics  . Smoking status: Never Smoker   . Smokeless tobacco: Not on file  . Alcohol Use: No  . Drug Use: No  . Sexual Activity: Not Currently   Other Topics Concern  . Not on file   Social  History Narrative    Review of Systems: Constitutional: Negative for fever, chills, diaphoresis, activity change, appetite change and fatigue. HENT: Negative for ear pain, nosebleeds, congestion, facial swelling, rhinorrhea, neck pain, neck stiffness and ear discharge.  Eyes: Negative for pain, discharge, redness, itching and visual disturbance. Respiratory: Negative for cough, choking, chest tightness, shortness of breath, wheezing and stridor.  Cardiovascular: Negative for chest pain, palpitations and leg swelling. Gastrointestinal: Negative for abdominal distention. Genitourinary: Negative for dysuria, urgency, hematuria, flank pain, decreased urine volume, difficulty urinating and dyspareunia.  Musculoskeletal: Negative for back pain, joint swelling, arthralgias and gait problem. Neurological: Negative for dizziness, tremors, seizures, syncope, facial asymmetry, speech difficulty, weakness, light-headedness, numbness and headaches.  Hematological: Negative for adenopathy. Does not bruise/bleed easily. Psychiatric/Behavioral: Negative for hallucinations, behavioral problems, confusion, dysphoric mood, decreased concentration and agitation.    Objective:   Filed Vitals:   05/02/14 1253  BP: 107/71  Pulse: 57  Temp: 97.9 F (36.6 C)  Resp: 16    Physical Exam  Neck: No thyromegaly present.  Cardiovascular: Normal rate, regular rhythm and normal heart sounds.   Pulmonary/Chest: Effort normal and breath sounds normal.  Musculoskeletal:  No CVA tenderness      Lab Results  Component Value Date   WBC 11.5* 11/15/2013   HGB 13.8 11/15/2013   HCT 39.7 11/15/2013   MCV 80.5 11/15/2013   PLT 323 11/15/2013  Lab Results  Component Value Date   CREATININE 0.54 11/15/2013   BUN 9 11/15/2013   NA 138 11/15/2013   K 4.2 11/15/2013   CL 103 11/15/2013   CO2 23 11/15/2013    No results found for: HGBA1C Lipid Panel     Component Value Date/Time   CHOL 166 11/23/2007 2101    TRIG 58 11/23/2007 2101   HDL 50 11/23/2007 2101   CHOLHDL 3.3 Ratio 11/23/2007 2101   VLDL 12 11/23/2007 2101   LDLCALC 104* 11/23/2007 2101       Assessment and plan:   Regina Calhoun was seen today for follow-up, hypothyroidism, medication refill and abdominal pain.  Diagnoses and all orders for this visit:  Frequent urination Orders: -     Urinalysis Dipstick  Acute cystitis without hematuria Orders: -     ciprofloxacin (CIPRO) 500 MG tablet; Take 1 tablet (500 mg total) by mouth 2 (two) times daily. Explain preventative measures she may take to avoid infection  Thyroid disease Orders: -     methimazole (TAPAZOLE) 5 MG tablet; Take 1 tablet (5 mg total) by mouth 2 (two) times daily. Explained to patient that endocrinology will manage her thyroid disease. I helped patient to make appt before leaving clinic.   Interpreter was used to communicate directly with patient for the entire encounter including providing detailed patient instructions.  Return if symptoms worsen or fail to improve.      Holland Commons, NP-C [facility] [phone] 05/02/2014, 1:09 PM

## 2014-05-02 NOTE — Progress Notes (Signed)
Pt comes in today for medication refill on Thyroid medication with management States she wants to be managed here for thyroid treatment Ran out of medication last week Denies fatigue, dizziness or headaches C/o lower back pain radiating to lower abdomen Denies vaginal d/c or burn with urination LMP- 04/07/14 Urine dipstick obtained

## 2014-06-07 ENCOUNTER — Other Ambulatory Visit: Payer: Self-pay | Admitting: Internal Medicine

## 2014-06-07 ENCOUNTER — Ambulatory Visit (INDEPENDENT_AMBULATORY_CARE_PROVIDER_SITE_OTHER): Payer: Self-pay | Admitting: Internal Medicine

## 2014-06-07 ENCOUNTER — Encounter: Payer: Self-pay | Admitting: Internal Medicine

## 2014-06-07 VITALS — BP 102/64 | HR 62 | Temp 97.9°F | Resp 12 | Wt 173.0 lb

## 2014-06-07 DIAGNOSIS — E0500 Thyrotoxicosis with diffuse goiter without thyrotoxic crisis or storm: Secondary | ICD-10-CM

## 2014-06-07 DIAGNOSIS — E079 Disorder of thyroid, unspecified: Secondary | ICD-10-CM

## 2014-06-07 LAB — TSH: TSH: 2.53 u[IU]/mL (ref 0.35–4.50)

## 2014-06-07 LAB — T3, FREE: T3, Free: 3 pg/mL (ref 2.3–4.2)

## 2014-06-07 LAB — T4, FREE: Free T4: 0.73 ng/dL (ref 0.60–1.60)

## 2014-06-07 NOTE — Progress Notes (Signed)
Patient ID: Regina Calhoun, female   DOB: [birth year], 44 y.o.   MRN: [id number]   HPI  Regina Calhoun is a 44 y.o.-year-old female, returning for f/u for thyrotoxicosis (likely Graves ds.). Last visit 5 mo ago. Patient is seen with the help of the Spanish interpreter. She does not have insurance (has [employer])!  Reviewed hx: She was found to have overactive thyroid at a visit with PCP. They were checked b/c she c/o:  - bloating - irregular menses - skipping 1 mo starting in Aug >> she thought she was pregnant >> UPT neg.  I reviewed pt's thyroid tests: Lab Results  Component Value Date   TSH 0.09* 02/26/2014   TSH 0.008* 01/21/2014   TSH 0.010* 11/30/2013   TSH 0.008* 11/15/2013   TSH 2.156 11/23/2007   FREET4 0.84 02/26/2014   FREET4 2.04* 01/21/2014   FREET4 2.01* 11/30/2013    At last visit, we started MMI 5 mg bid as the suspicion for Graves ds was high. We did not do a thyroid uptake and scan due to price (no insurance). Subsequent TFTs have improved (see above). We continued the same dose of MMI.  Pt denies feeling nodules in neck, hoarseness, + dysphagia (not new)/no odynophagia, SOB with lying down.  She denies: - no fatigue - no excessive sweating/heat intolerance - sometimes tremors - no anxiety - no palpitations - no hyperdefecation - no weight loss; + weight gain 7 lbs since last visit - no hair loss - regular menses now  ROS: Constitutional: see HPI, + poor sleep Eyes:+ blurry vision, no xerophthalmia ENT: no sore throat, no nodules palpated in throat, + dysphagia/no odynophagia, no hoarseness, + tinnitus Cardiovascular: + CP - sharp, not related to I/E/no SOB/palpitations/leg swelling Respiratory: no cough/SOB Gastrointestinal: no N/V/D/C Musculoskeletal: no muscle/joint aches Skin: no rashes, no hair loss Neurological: no tremors/numbness/tingling/dizziness   I reviewed pt's medications, allergies, PMH, social hx, family hx, and changes were  documented in the history of present illness. Otherwise, unchanged from my initial visit note:  No past medical history - other than described above.  No past surgical history.   History   Social History  . Marital Status: Married    Spouse Name: N/A    Number of Children: 2   Occupational History  . cleaning   Social History Main Topics  . Smoking status: Never Smoker   . Smokeless tobacco: Not on file  . Alcohol Use: No  . Drug Use: No   Current Outpatient Prescriptions on File Prior to Visit  Medication Sig Dispense Refill  . cetirizine (ZYRTEC) 10 MG tablet Take 1 tablet (10 mg total) by mouth daily as needed (itching). (Patient not taking: Reported on 01/17/2014) 15 tablet 0  . ciprofloxacin (CIPRO) 500 MG tablet Take 1 tablet (500 mg total) by mouth 2 (two) times daily. (Patient not taking: Reported on 06/07/2014) 6 tablet 0  . methimazole (TAPAZOLE) 5 MG tablet Take 1 tablet (5 mg total) by mouth 2 (two) times daily. 60 tablet 0  . multivitamin-iron-minerals-folic acid (CENTRUM) chewable tablet Chew 1 tablet by mouth daily.    . ranitidine (ZANTAC) 300 MG tablet Take 1 tablet (300 mg total) by mouth at bedtime as needed (itching). (Patient not taking: Reported on 01/17/2014) 15 tablet 0   No current facility-administered medications on file prior to visit.   No Known Allergies Family History  Problem Relation Age of Onset  . Polycystic ovary syndrome Daughter   . Clotting disorder Paternal  Grandmother   . Hypothyroidism Mother    PE: BP 102/64 mmHg  Pulse 62  Temp(Src) 97.9 F (36.6 C) (Oral)  Resp 12  Wt 173 lb (78.472 kg)  SpO2 96% Body mass index is 31.63 kg/(m^2).  Wt Readings from Last 3 Encounters:  06/07/14 173 lb (78.472 kg)  05/02/14 170 lb (77.111 kg)  01/17/14 165 lb 9.6 oz (75.116 kg)   Constitutional: overweight, in NAD Eyes: PERRLA, EOMI, no exophthalmos ENT: moist mucous membranes, + mild symmetric, non-nodular,  thyromegaly, no cervical  lymphadenopathy Cardiovascular: RRR, No MRG Respiratory: CTA B Gastrointestinal: abdomen soft, NT, ND, BS+ Musculoskeletal: no deformities, strength intact in all 4 Skin: moist, warm, no rashes Neurological: Fine tremor with outstretched hands, DTR normal in all 4  ASSESSMENT: 1. 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  PLAN:  1. Patient with a low TSH, but without thyrotoxic sxs or symptoms: no weight loss, heat intolerance, hyperdefecation, palpitations, anxiety, tachycardia, but with mild hand tremor on physical exam.  - the fact that she does not have insurance limits our capability to perform this test, but I suspect Graves ds. Based on the hx and the absence of nodularity on thyroid exam. Also, she responded very well to MMI. - I suggested that we check the TSH, fT3 and fT4 and also thyroid stimulating antibodies to screen for Graves' disease.  - for now will continue MMI 5 mg bid (no side effects) and will change the dose when labs are back - I do not feel that we need to add beta blockers at this time, since she is not tachycardic, anxious, or tremulous - regarding her CP >> I advised her to call and d/w PCP - RTC in 6 months, but in 6 weeks for repeat labs - needs MMI refilled when labs are back  Per patient's preference, communication should be through the Spanish interpreter line.  Component     Latest Ref Rng 06/07/2014  TSH     0.35 - 4.50 uIU/mL 2.53  Free T4     0.60 - 1.60 ng/dL 1.610.73  T3, Free     2.3 - 4.2 pg/mL 3.0  Thyroid tests have normalized. We can decrease her MMI dose to 5 mg daily. We'll have her back for repeat labs in 6 weeks.

## 2014-06-07 NOTE — Patient Instructions (Addendum)
Please stop at the lab.  Continue Methimazole 5 mg 2x a day.  Please come back for a follow-up appointment in 6 months, but be aware that we will likely need labs before then.  Schedule an appt for labs in 6 weeks.

## 2014-06-10 MED ORDER — METHIMAZOLE 5 MG PO TABS: 5.0000 mg | ORAL_TABLET | Freq: Every day | ORAL | Status: DC

## 2014-06-11 ENCOUNTER — Encounter: Payer: Self-pay | Admitting: *Deleted

## 2014-07-19 ENCOUNTER — Encounter: Payer: Self-pay | Admitting: Internal Medicine

## 2014-07-19 ENCOUNTER — Other Ambulatory Visit (INDEPENDENT_AMBULATORY_CARE_PROVIDER_SITE_OTHER): Payer: Self-pay

## 2014-07-19 DIAGNOSIS — E0500 Thyrotoxicosis with diffuse goiter without thyrotoxic crisis or storm: Secondary | ICD-10-CM

## 2014-07-19 LAB — T4, FREE: Free T4: 0.7 ng/dL (ref 0.60–1.60)

## 2014-07-19 LAB — TSH: TSH: 2.46 u[IU]/mL (ref 0.35–4.50)

## 2014-07-19 LAB — T3, FREE: T3 FREE: 2.7 pg/mL (ref 2.3–4.2)

## 2014-08-06 ENCOUNTER — Telehealth: Payer: Self-pay

## 2014-08-06 NOTE — Telephone Encounter (Signed)
Tried to leave VM but this VM is set up in spanish and did not beep.

## 2014-08-06 NOTE — Telephone Encounter (Signed)
-----   Message from Carlus Pavlovristina Gherghe, MD sent at 08/05/2014  1:25 PM EDT ----- Carollee HerterShannon, can you please call pt: Great TFTs >> decrease MMI to 5 mg every other day. We need repeat TFTs in 6 weeks: TSH, fT4 and fT3 - can you please order?

## 2014-08-07 ENCOUNTER — Encounter: Payer: Self-pay | Admitting: *Deleted

## 2014-10-21 ENCOUNTER — Other Ambulatory Visit: Payer: Self-pay | Admitting: Internal Medicine

## 2014-12-10 ENCOUNTER — Ambulatory Visit (INDEPENDENT_AMBULATORY_CARE_PROVIDER_SITE_OTHER): Payer: Self-pay | Admitting: Internal Medicine

## 2014-12-10 ENCOUNTER — Encounter: Payer: Self-pay | Admitting: Internal Medicine

## 2014-12-10 VITALS — BP 118/72 | HR 60 | Temp 97.9°F | Resp 12 | Wt 169.0 lb

## 2014-12-10 DIAGNOSIS — E0500 Thyrotoxicosis with diffuse goiter without thyrotoxic crisis or storm: Secondary | ICD-10-CM

## 2014-12-10 LAB — T4, FREE: Free T4: 0.75 ng/dL (ref 0.60–1.60)

## 2014-12-10 LAB — TSH: TSH: 4.31 u[IU]/mL (ref 0.35–4.50)

## 2014-12-10 LAB — T3, FREE: T3, Free: 2.7 pg/mL (ref 2.3–4.2)

## 2014-12-10 MED ORDER — METHIMAZOLE 5 MG PO TABS: 5.0000 mg | ORAL_TABLET | ORAL | Status: DC

## 2014-12-10 NOTE — Progress Notes (Signed)
Patient ID: Regina Calhoun, female   DOB: 21-Jun-1970, 44 y.o.   MRN: [id number]   HPI  Regina Calhoun is a 44 y.o.-year-old female, returning for f/u for thyrotoxicosis (likely Graves ds.). Last visit 6 mo ago. Patient is seen with the help of the Spanish interpreter. She does not have insurance (has [employer])!  Reviewed hx: She was found to have overactive thyroid at a visit with PCP. They were checked b/c she c/o:  - bloating - irregular menses - skipping 1 mo starting in Aug >> she thought she was pregnant >> UPT neg.  I reviewed pt's thyroid tests: Lab Results  Component Value Date   TSH 2.46 07/19/2014   TSH 2.53 06/07/2014   TSH 0.09* 02/26/2014   TSH 0.008* 01/21/2014   TSH 0.010* 11/30/2013   TSH 0.008* 11/15/2013   TSH 2.156 11/23/2007   FREET4 0.70 07/19/2014   FREET4 0.73 06/07/2014   FREET4 0.84 02/26/2014   FREET4 2.04* 01/21/2014   FREET4 2.01* 11/30/2013    We started MMI 5 mg bid as the suspicion for Graves ds was high. We did not do a thyroid uptake and scan due to price (no insurance). Subsequent TFTs have improved (see above) >> we were able to decrease the methimazole to 5 mg daily, and I advised her to decrease the dose to 5 mg every other day in 08/2014. She did not change the dose as her med bottle mentioned "take once a day". I explained that when we changed the dose she did not need a refill so the new dose was not sent to the pharmacy.   Pt denies feeling nodules in neck, hoarseness, dysphagia/odynophagia, SOB with lying down.  She denies: - + sometimes poor sleep - + hair loss - no fatigue - no excessive sweating/heat intolerance - sometimes tremors - no anxiety - no palpitations - no hyperdefecation - no significant weight loss or gain - regular menses now  ROS: Constitutional: see HPI, + poor sleep Eyes:no blurry vision, no xerophthalmia ENT: no sore throat, no nodules palpated in throat, no dysphagia/no odynophagia, no  hoarseness Cardiovascular: no CP/SOB/palpitations/leg swelling Respiratory: no cough/SOB Gastrointestinal: no N/V/D/C Musculoskeletal: no muscle/joint aches Skin: no rashes, no hair loss Neurological: no tremors/numbness/tingling/dizziness  I reviewed pt's medications, allergies, PMH, social hx, family hx, and changes were documented in the history of present illness. Otherwise, unchanged from my initial visit note:  No past medical history - other than described above.  No past surgical history.   History   Social History  . Marital Status: Married    Spouse Name: N/A    Number of Children: 2   Occupational History  . cleaning   Social History Main Topics  . Smoking status: Never Smoker   . Smokeless tobacco: Not on file  . Alcohol Use: No  . Drug Use: No   Current Outpatient Prescriptions on File Prior to Visit  Medication Sig Dispense Refill  . methimazole (TAPAZOLE) 5 MG tablet TAKE 1 TABLET BY MOUTH DAILY. 45 tablet 1   No current facility-administered medications on file prior to visit.   No Known Allergies Family History  Problem Relation Age of Onset  . Polycystic ovary syndrome Daughter   . Clotting disorder Paternal Grandmother   . Hypothyroidism Mother    PE: BP 118/72 mmHg  Pulse 60  Temp(Src) 97.9 F (36.6 C) (Oral)  Resp 12  Wt 169 lb (76.658 kg)  SpO2 98% Body mass index is 30.9 kg/(m^2).  Wt Readings from Last 3 Encounters:  12/10/14 169 lb (76.658 kg)  06/07/14 173 lb (78.472 kg)  05/02/14 170 lb (77.111 kg)   Constitutional: overweight, in NAD Eyes: PERRLA, EOMI, no exophthalmos ENT: moist mucous membranes, + mild symmetric, non-nodular,  thyromegaly, no cervical lymphadenopathy Cardiovascular: RRR, No MRG Respiratory: CTA B Gastrointestinal: abdomen soft, NT, ND, BS+ Musculoskeletal: no deformities, strength intact in all 4 Skin: moist, warm, no rashes Neurological: no tremor with outstretched hands, DTR normal in all  4  ASSESSMENT: 1. 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  PLAN:  1. Patient with a low TSH, but without thyrotoxic sxs or symptoms: no weight loss, heat intolerance, hyperdefecation, palpitations, anxiety, tachycardia, tremor. - the fact that she does not have insurance limits our capability to perform this test, but I suspect Graves ds. Based on the hx and the absence of nodularity on thyroid exam. Also, she responded very well to MMI. - will check the TSH, fT3 and fT4 today - for now will continue MMI 5 mg daily (no side effects) and will change the dose when labs are back - we do not need to add beta blockers at this time, since she is not tachycardic, anxious, or tremulous - RTC in 6 months, but likely in 6 weeks for repeat labs  Per patient's preference, communication should be through the Spanish interpreter line.  Office Visit on 12/10/2014  Component Date Value Ref Range Status  . TSH 12/10/2014 4.31  0.35 - 4.50 uIU/mL Final  . Free T4 12/10/2014 0.75  0.60 - 1.60 ng/dL Final  . T3, Free 40/98/119111/09/2014 2.7  2.3 - 4.2 pg/mL Final   TSH at the upper limit of normal >> will need to decrease the methimazole dose to 5 mg every other day. We'll check the labs again in 6 weeks.

## 2014-12-10 NOTE — Patient Instructions (Addendum)
Please stop at the lab.  Please come back for a follow-up appointment in 6 months.  Do not take Biotin or Hair skin and nails vitamins every time you have thyroid labs checked.

## 2014-12-17 ENCOUNTER — Encounter: Payer: Self-pay | Admitting: *Deleted

## 2015-01-16 ENCOUNTER — Ambulatory Visit: Payer: Self-pay

## 2015-05-05 ENCOUNTER — Ambulatory Visit: Payer: Self-pay

## 2015-05-05 ENCOUNTER — Ambulatory Visit: Payer: Self-pay | Attending: Internal Medicine | Admitting: Internal Medicine

## 2015-05-05 ENCOUNTER — Encounter: Payer: Self-pay | Admitting: Internal Medicine

## 2015-05-05 VITALS — BP 118/77 | HR 60 | Temp 98.3°F | Resp 16 | Ht 63.0 in | Wt 171.0 lb

## 2015-05-05 DIAGNOSIS — E0500 Thyrotoxicosis with diffuse goiter without thyrotoxic crisis or storm: Secondary | ICD-10-CM | POA: Insufficient documentation

## 2015-05-05 DIAGNOSIS — Z0000 Encounter for general adult medical examination without abnormal findings: Secondary | ICD-10-CM

## 2015-05-05 DIAGNOSIS — Z79899 Other long term (current) drug therapy: Secondary | ICD-10-CM | POA: Insufficient documentation

## 2015-05-05 DIAGNOSIS — Z0001 Encounter for general adult medical examination with abnormal findings: Secondary | ICD-10-CM | POA: Insufficient documentation

## 2015-05-05 LAB — CBC WITH DIFFERENTIAL/PLATELET
BASOS ABS: 77 {cells}/uL (ref 0–200)
BASOS PCT: 1 %
EOS PCT: 7 %
Eosinophils Absolute: 539 cells/uL — ABNORMAL HIGH (ref 15–500)
HEMATOCRIT: 41.7 % (ref 35.0–45.0)
Hemoglobin: 14 g/dL (ref 11.7–15.5)
Lymphocytes Relative: 39 %
Lymphs Abs: 3003 cells/uL (ref 850–3900)
MCH: 28.8 pg (ref 27.0–33.0)
MCHC: 33.6 g/dL (ref 32.0–36.0)
MCV: 85.8 fL (ref 80.0–100.0)
MONOS PCT: 6 %
MPV: 10 fL (ref 7.5–12.5)
Monocytes Absolute: 462 cells/uL (ref 200–950)
Neutro Abs: 3619 cells/uL (ref 1500–7800)
Neutrophils Relative %: 47 %
Platelets: 355 10*3/uL (ref 140–400)
RBC: 4.86 MIL/uL (ref 3.80–5.10)
RDW: 13.8 % (ref 11.0–15.0)
WBC: 7.7 10*3/uL (ref 3.8–10.8)

## 2015-05-05 NOTE — Progress Notes (Signed)
Patient is here for physical with pap.   Patient decline STD testing, or any pain today.   Patient states she's at the end of her menses.

## 2015-05-05 NOTE — Progress Notes (Signed)
Patient ID: Regina Calhoun, female   DOB: 05/04/1970, 45 y.o.   MRN: [id number]  CC: physical  HPI: Regina Calhoun is a 45 y.o. female here today for a follow up visit.  Patient has past medical history of Graves Disease. Patient reports that she has not had a pap smear in 3 years and it was last normal. She is up to date on her mammogram. Patient only takes Tapazole for her thyroid. She does not consume tobacco, alcohol, or street drugs. Patient denies complaints today.  No Known Allergies History reviewed. No pertinent past medical history. Current Outpatient Prescriptions on File Prior to Visit  Medication Sig Dispense Refill  . methimazole (TAPAZOLE) 5 MG tablet Take 1 tablet (5 mg total) by mouth every other day. 45 tablet 1   No current facility-administered medications on file prior to visit.   Family History  Problem Relation Age of Onset  . Polycystic ovary syndrome Daughter   . Clotting disorder Paternal Grandmother   . Hypothyroidism Mother    Social History   Social History  . Marital Status: Married    Spouse Name: N/A  . Number of Children: N/A  . Years of Education: N/A   Occupational History  . Not on file.   Social History Main Topics  . Smoking status: Never Smoker   . Smokeless tobacco: Not on file  . Alcohol Use: No  . Drug Use: No  . Sexual Activity: Not Currently   Other Topics Concern  . Not on file   Social History Narrative    Review of Systems: Constitutional: Negative for fever, chills, diaphoresis, activity change, appetite change and fatigue. HENT: Negative for ear pain, nosebleeds, congestion, facial swelling, rhinorrhea, neck pain, neck stiffness and ear discharge.  Eyes: Negative for pain, discharge, redness, itching and visual disturbance. Respiratory: Negative for cough, choking, chest tightness, shortness of breath, wheezing and stridor.  Cardiovascular: Negative for chest pain, palpitations and leg swelling. Gastrointestinal:  Negative for abdominal distention. Genitourinary: Negative for dysuria, urgency, frequency, hematuria, flank pain, decreased urine volume, difficulty urinating and dyspareunia.  Musculoskeletal: Negative for back pain, joint swelling, arthralgias and gait problem. Neurological: Negative for dizziness, tremors, seizures, syncope, facial asymmetry, speech difficulty, weakness, light-headedness, numbness and headaches.  Hematological: Negative for adenopathy. Does not bruise/bleed easily. Psychiatric/Behavioral: Negative for hallucinations, behavioral problems, confusion, dysphoric mood, decreased concentration and agitation.    Objective:   Filed Vitals:   05/05/15 1457  BP: 118/77  Pulse: 60  Temp: 98.3 F (36.8 C)  Resp: 16    Physical Exam: Constitutional: Patient appears well-developed and well-nourished. No distress. HENT: Normocephalic, atraumatic, External right and left ear normal. Oropharynx is clear and moist.  Eyes: Conjunctivae and EOM are normal. PERRLA, no scleral icterus. Neck: Normal ROM. Neck supple. No JVD. No tracheal deviation. No thyromegaly. CVS: RRR, S1/S2 +, no murmurs, no gallops, no carotid bruit.  Pulmonary: Effort and breath sounds normal, no stridor, rhonchi, wheezes, rales.  Abdominal: Soft. BS +,  no distension, tenderness, rebound or guarding.  Musculoskeletal: Normal range of motion. No edema and no tenderness.  Lymphadenopathy: No lymphadenopathy noted, cervical, inguinal or axillary Neuro: Alert. Normal reflexes, muscle tone coordination. No cranial nerve deficit. Skin: Skin is warm and dry. No rash noted. Not diaphoretic. No erythema. No pallor. Psychiatric: Normal mood and affect. Behavior, judgment, thought content normal. Genitalia: Normal female without lesion, discharge or tenderness, NSSA, NT, no adnexal masses felt on exam  Breast: No tenderness, masses, or nipple abnormality  Lab Results  Component Value Date   WBC 11.5* 11/15/2013    HGB 13.8 11/15/2013   HCT 39.7 11/15/2013   MCV 80.5 11/15/2013   PLT 323 11/15/2013   Lab Results  Component Value Date   CREATININE 0.54 11/15/2013   BUN 9 11/15/2013   NA 138 11/15/2013   K 4.2 11/15/2013   CL 103 11/15/2013   CO2 23 11/15/2013    No results found for: HGBA1C Lipid Panel     Component Value Date/Time   CHOL 166 11/23/2007 2101   TRIG 58 11/23/2007 2101   HDL 50 11/23/2007 2101   CHOLHDL 3.3 Ratio 11/23/2007 2101   VLDL 12 11/23/2007 2101   LDLCALC 104* 11/23/2007 2101       Assessment and plan:   Regina Calhoun was seen today for annual exam.  Diagnoses and all orders for this visit:  Annual physical exam -     Cytology - PAP [place] -     CBC with Differential -     Basic Metabolic Panel  Interpreter was used to communicate directly with patient for the entire encounter including providing detailed patient instructions.   Return if symptoms worsen or fail to improve.      Ambrose FinlandValerie A Janis Cuffe, NP-C [facility] [phone] 05/05/2015, 3:08 PM

## 2015-05-06 LAB — BASIC METABOLIC PANEL
BUN: 9 mg/dL (ref 7–25)
CHLORIDE: 106 mmol/L (ref 98–110)
CO2: 23 mmol/L (ref 20–31)
Calcium: 9.1 mg/dL (ref 8.6–10.2)
Creat: 0.69 mg/dL (ref 0.50–1.10)
Glucose, Bld: 103 mg/dL — ABNORMAL HIGH (ref 65–99)
POTASSIUM: 4.1 mmol/L (ref 3.5–5.3)
Sodium: 139 mmol/L (ref 135–146)

## 2015-05-06 LAB — CERVICOVAGINAL ANCILLARY ONLY: Wet Prep (BD Affirm): POSITIVE — AB

## 2015-05-07 LAB — CYTOLOGY - PAP

## 2015-05-19 ENCOUNTER — Telehealth: Payer: Self-pay

## 2015-05-19 NOTE — Telephone Encounter (Signed)
-----   Message from Ambrose FinlandValerie A Keck, NP sent at 05/15/2015  9:40 PM EDT ----- Normal cytology. Repeat pap in 3 years

## 2015-05-19 NOTE — Telephone Encounter (Signed)
[employer] interpreter Lurena JoinerRebecca [phone] placed call to number available on file. Interpreter called, but patient VM didn't pickup to leave a message.  Note to patient:  Please let patient know that her pap returned negative and to repeat in 3 yrs.

## 2015-05-30 NOTE — Telephone Encounter (Addendum)
Contacted [employer] interpreter and with [place] [phone]. Interpreter called patient number on file, but patients VM is not setup to leave a VM.  Letter sent to patients address on file for patient to call the office.

## 2015-06-09 ENCOUNTER — Telehealth: Payer: Self-pay | Admitting: Internal Medicine

## 2015-06-09 NOTE — Telephone Encounter (Signed)
Pt. Returned call. Pt. Stated [facility]. Please f/u

## 2015-06-10 ENCOUNTER — Ambulatory Visit: Payer: Self-pay | Admitting: Internal Medicine

## 2015-06-11 ENCOUNTER — Ambulatory Visit (INDEPENDENT_AMBULATORY_CARE_PROVIDER_SITE_OTHER): Payer: Self-pay | Admitting: Internal Medicine

## 2015-06-11 VITALS — BP 122/80 | HR 62 | Temp 98.9°F | Resp 12 | Wt 171.0 lb

## 2015-06-11 DIAGNOSIS — E0500 Thyrotoxicosis with diffuse goiter without thyrotoxic crisis or storm: Secondary | ICD-10-CM

## 2015-06-11 LAB — T3, FREE: T3, Free: 3.3 pg/mL (ref 2.3–4.2)

## 2015-06-11 LAB — TSH: TSH: 2.49 u[IU]/mL (ref 0.35–4.50)

## 2015-06-11 LAB — T4, FREE: Free T4: 0.93 ng/dL (ref 0.60–1.60)

## 2015-06-11 NOTE — Telephone Encounter (Signed)
[employer] interpreter Doree FudgeLuz [phone] called patient twice with no answer. Interpreter wasn't able to leave a message.

## 2015-06-11 NOTE — Progress Notes (Signed)
Patient ID: Regina Calhoun, female   DOB: 1970-11-20, 45 y.o.   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 Last visit 6 mo ago. Patient is seen with the help of the Spanish interpreter. She does not have insurance (has [employer])!  Reviewed hx: She was found to have overactive thyroid at a visit with PCP. They were checked b/c she c/o:  - bloating - irregular menses - skipping 1 mo starting in Aug >> she thought she was pregnant >> UPT neg.  I reviewed pt's thyroid tests: Lab Results  Component Value Date   TSH 4.31 12/10/2014   TSH 2.46 07/19/2014   TSH 2.53 06/07/2014   TSH 0.09* 02/26/2014   TSH 0.008* 01/21/2014   TSH 0.010* 11/30/2013   TSH 0.008* 11/15/2013   TSH 2.156 11/23/2007   FREET4 0.75 12/10/2014   FREET4 0.70 07/19/2014   FREET4 0.73 06/07/2014   FREET4 0.84 02/26/2014   FREET4 2.04* 01/21/2014   FREET4 2.01* 11/30/2013    We started MMI 5 mg bid as the suspicion for Graves ds was high. We did not do a thyroid uptake and scan due to price (no insurance). Subsequent TFTs have improved (see above) >> we were able to decrease the methimazole to 5 mg daily, and I advised her to decrease the dose to 5 mg every other day in 08/2014. She did not change the dose as her med bottle mentioned "take once a day". I explained that when we changed the dose she did not need a refill so the new dose was not sent to the pharmacy.  At last visit, in 12/2014, I advised her to decrease the dose of methimazole to 5 mg every other day and come back for labs in 6 weeks. She did not return for labs. She continues this dose today.   Pt denies feeling nodules in neck, hoarseness, dysphagia/odynophagia, SOB with lying down.  She denies: - + improved poor sleep - + improved hair loss - no fatigue - no excessive sweating/heat intolerance - no tremors - no anxiety - no palpitations - no  hyperdefecation - no significant weight loss or gain - regular menses now  ROS: Constitutional: see HPI Eyes:no blurry vision, no xerophthalmia ENT: no sore throat, no nodules palpated in throat, no dysphagia/no odynophagia, no hoarseness Cardiovascular: no CP/SOB/palpitations/leg swelling Respiratory: no cough/SOB Gastrointestinal: no N/V/D/C Musculoskeletal: no muscle/joint aches Skin: no rashes, no hair loss Neurological: no tremors/numbness/tingling/dizziness  I reviewed pt's medications, allergies, PMH, social hx, family hx, and changes were documented in the history of present illness. Otherwise, unchanged from my initial visit note:  No past medical history - other than described above.  No past surgical history.   History   Social History  . Marital Status: Married    Spouse Name: N/A    Number of Children: 2   Occupational History  . cleaning   Social History Main Topics  . Smoking status: Never Smoker   . Smokeless tobacco: Not on file  . Alcohol Use: No  . Drug Use: No   Current Outpatient Prescriptions on File Prior to Visit  Medication Sig Dispense Refill  . methimazole (TAPAZOLE) 5 MG tablet Take 1 tablet (5 mg total) by mouth every other day. 45 tablet 1   No current facility-administered medications on file prior to visit.   No Known Allergies Family History  Problem Relation Age of Onset  .  Polycystic ovary syndrome Daughter   . Clotting disorder Paternal Grandmother   . Hypothyroidism Mother    PE: BP 122/80 mmHg  Pulse 62  Temp(Src) 98.9 F (37.2 C) (Oral)  Resp 12  Wt 171 lb (77.565 kg)  SpO2 96% Body mass index is 30.3 kg/(m^2).  Wt Readings from Last 3 Encounters:  06/11/15 171 lb (77.565 kg)  05/05/15 171 lb (77.565 kg)  12/10/14 169 lb (76.658 kg)   Constitutional: overweight, in NAD Eyes: PERRLA, EOMI, no exophthalmos ENT: moist mucous membranes, + mild symmetric thyromegaly, no cervical lymphadenopathy Cardiovascular: RRR,  No MRG Respiratory: CTA B Gastrointestinal: abdomen soft, NT, ND, BS+ Musculoskeletal: no deformities, strength intact in all 4 Skin: moist, warm, no rashes Neurological: very fine tremor with outstretched hands, DTR normal in all 4  ASSESSMENT: 1. 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  PLAN:  1. Patient with a low TSH, but without thyrotoxic sxs or symptoms: no weight loss, heat intolerance, hyperdefecation, palpitations, anxiety, tachycardia; she has very fine B hand tremor. - we did not perform an uptake and scan (2/2 cost), but I suspect Graves ds. based on the hx and the absence of nodularity on thyroid exam. Also, she responded very well to MMI. - will check the TSH, fT3 and fT4 today - for now will continue MMI 5 mg every other day (no side effects) and will change the dose when labs are back - we do not need to add beta blockers at this time, since she is not tachycardic, anxious, or tremulous - RTC in 6 months, but likely in 6 weeks for repeat labs  Per patient's preference, communication should be through the Spanish interpreter line.  Pt needs MMI refills, if we are going to continue the medication.   Component     Latest Ref Rng 06/11/2015  TSH     0.35 - 4.50 uIU/mL 2.49  T4,Free(Direct)     0.60 - 1.60 ng/dL 0.34  Triiodothyronine,Free,Serum     2.3 - 4.2 pg/mL 3.3   We'll try to stop methimazole and repeat TFTs in 5-6 weeks.

## 2015-06-11 NOTE — Patient Instructions (Signed)
Please stop at the lab.  Please continue Methimazole 5 mg every other day for now.  Please come back for a follow-up appointment in 6 months.

## 2015-06-12 ENCOUNTER — Encounter: Payer: Self-pay | Admitting: Internal Medicine

## 2015-06-13 ENCOUNTER — Telehealth: Payer: Self-pay | Admitting: Internal Medicine

## 2015-06-13 NOTE — Telephone Encounter (Signed)
Pt ran out of thyroid medication and would like to know if she needs a refill or if she is to discontinue

## 2015-06-13 NOTE — Telephone Encounter (Signed)
Tried to call pt via Facilities managerpanish Interpreter line. They were unable to reach her or lvm. Will try again.

## 2015-06-16 NOTE — Telephone Encounter (Signed)
PT returning phone call to Carollee HerterShannon, PT friend Lennox GrumblesRudy is interpreting for her and said you can call at any time tomorrow

## 2015-06-18 ENCOUNTER — Encounter: Payer: Self-pay | Admitting: *Deleted

## 2015-06-18 NOTE — Telephone Encounter (Signed)
Tried to call patient again. No answer. Result letter mailed.

## 2015-07-18 ENCOUNTER — Other Ambulatory Visit (INDEPENDENT_AMBULATORY_CARE_PROVIDER_SITE_OTHER): Payer: Self-pay

## 2015-07-18 DIAGNOSIS — E0500 Thyrotoxicosis with diffuse goiter without thyrotoxic crisis or storm: Secondary | ICD-10-CM

## 2015-07-18 LAB — TSH: TSH: 2.59 u[IU]/mL (ref 0.35–4.50)

## 2015-07-18 LAB — T3, FREE: T3 FREE: 3.3 pg/mL (ref 2.3–4.2)

## 2015-07-18 LAB — T4, FREE: FREE T4: 0.88 ng/dL (ref 0.60–1.60)

## 2015-07-21 ENCOUNTER — Telehealth: Payer: Self-pay | Admitting: Internal Medicine

## 2015-07-21 NOTE — Telephone Encounter (Signed)
Called patient. Explained lab results to patient. Patient understood, no questions/concerns.

## 2015-12-12 ENCOUNTER — Ambulatory Visit (INDEPENDENT_AMBULATORY_CARE_PROVIDER_SITE_OTHER): Payer: Self-pay | Admitting: Internal Medicine

## 2015-12-12 VITALS — BP 120/84 | HR 54 | Ht 62.5 in | Wt 167.8 lb

## 2015-12-12 DIAGNOSIS — E0500 Thyrotoxicosis with diffuse goiter without thyrotoxic crisis or storm: Secondary | ICD-10-CM

## 2015-12-12 LAB — T4, FREE: FREE T4: 0.78 ng/dL (ref 0.60–1.60)

## 2015-12-12 LAB — T3, FREE: T3 FREE: 2.8 pg/mL (ref 2.3–4.2)

## 2015-12-12 LAB — TSH: TSH: 3.6 u[IU]/mL (ref 0.35–4.50)

## 2015-12-12 NOTE — Patient Instructions (Signed)
Please stop at the lab.  For now, stay off the Methimazole.  Please return in 6 months with your sugar log.

## 2015-12-12 NOTE — Progress Notes (Signed)
Patient ID: Regina Calhoun, female   DOB: 10-Mar-1970, 45 y.o.   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 Last visit 6 mo ago. Patient is seen with the help of the Spanish interpreter. She does not have insurance (has [employer]).  Reviewed hx: She was found to have overactive thyroid at a visit with PCP. They were checked b/c she c/o:  - bloating - irregular menses - skipping 1 mo starting in Aug >> she thought she was pregnant >> UPT neg.  I reviewed pt's thyroid tests: Lab Results  Component Value Date   TSH 2.59 07/18/2015   TSH 2.49 06/11/2015   TSH 4.31 12/10/2014   TSH 2.46 07/19/2014   TSH 2.53 06/07/2014   TSH 0.09 (L) 02/26/2014   TSH 0.008 (L) 01/21/2014   TSH 0.010 (L) 11/30/2013   TSH 0.008 (L) 11/15/2013   TSH 2.156 11/23/2007   FREET4 0.88 07/18/2015   FREET4 0.93 06/11/2015   FREET4 0.75 12/10/2014   FREET4 0.70 07/19/2014   FREET4 0.73 06/07/2014   FREET4 0.84 02/26/2014   FREET4 2.04 (H) 01/21/2014   FREET4 2.01 (H) 11/30/2013    Component     Latest Ref Rng & Units 01/17/2014  TSI     <140 % baseline 341 (H)   We started MMI 5 mg bid as the suspicion for Graves ds was high. We could not do a thyroid uptake and scan due to price (no insurance). Subsequent TFTs have improved (see above) >> we were able to stop MMI in 06/2015. Subsequent TFTs have been normal.  Pt denies feeling nodules in neck, hoarseness, dysphagia/odynophagia, SOB with lying down.  She denies: - poor sleep - no fatigue - no excessive sweating/heat intolerance - no tremors - no anxiety - no palpitations - no hyperdefecation - no significant weight loss or gain - she has regular menses   She c/o: - + hair loss She is on Biotin  - not in last few days.   ROS: Constitutional: see HPI Eyes:no blurry vision, no xerophthalmia ENT: no sore throat, no nodules palpated in throat, no  dysphagia/no odynophagia, no hoarseness Cardiovascular: no CP/SOB/palpitations/leg swelling Respiratory: no cough/SOB Gastrointestinal: no N/V/D/C Musculoskeletal: no muscle/joint aches Skin: no rashes, + hair loss Neurological: no tremors/numbness/tingling/dizziness  I reviewed pt's medications, allergies, PMH, social hx, family hx, and changes were documented in the history of present illness. Otherwise, unchanged from my initial visit note:  No past medical history - other than described above.  No past surgical history.   History   Social History  . Marital Status: Married    Spouse Name: N/A    Number of Children: 2   Occupational History  . cleaning   Social History Main Topics  . Smoking status: Never Smoker   . Smokeless tobacco: Not on file  . Alcohol Use: No  . Drug Use: No   No current outpatient prescriptions on file prior to visit.   No current facility-administered medications on file prior to visit.    No Known Allergies Family History  Problem Relation Age of Onset  . Polycystic ovary syndrome Daughter   . Clotting disorder Paternal Grandmother   . Hypothyroidism Mother    PE: BP 120/84 (BP Location: Right Arm, Patient Position: Sitting, Cuff Size: Normal)   Pulse (!) 54   Ht 5' 2.5" (1.588 m)   Wt 167 lb 12.8 oz (76.1 kg)  LMP 11/17/2015 (Approximate)   SpO2 96%   BMI 30.20 kg/m  Body mass index is 30.2 kg/m.  Wt Readings from Last 3 Encounters:  12/12/15 167 lb 12.8 oz (76.1 kg)  06/11/15 171 lb (77.6 kg)  05/05/15 171 lb (77.6 kg)   Constitutional: overweight, in NAD Eyes: PERRLA, EOMI, no exophthalmos ENT: moist mucous membranes, + mild symmetric thyromegaly, no cervical lymphadenopathy Cardiovascular: RRR, No MRG Respiratory: CTA B Gastrointestinal: abdomen soft, NT, ND, BS+ Musculoskeletal: no deformities, strength intact in all 4 Skin: moist, warm, no rashes Neurological: no tremor with outstretched hands, DTR normal in all  4  ASSESSMENT: 1. 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  PLAN:  1. Patient with a low TSH, but without thyrotoxic sxs or symptoms: no weight loss (she had 4 lb wt loss since last visit, but not during active Graves ds.), heat intolerance, hyperdefecation, palpitations, anxiety, tachycardia; she did have very fine B hand tremor >> resolved. She also has hair loss. - we did not perform an uptake and scan (2/2 cost), but I suspect Graves ds. based on the hx and the absence of nodularity on thyroid exam. Also, she responded very well to MMI >> we were able to stop this 6 mo ago, with still normal TFTs 1 mo later. - will check the TSH, fT3 and fT4 today + add TSI level to see if she needs to restart MMI - we do not need to add beta blockers at this time, since she is not tachycardic, anxious, or tremulous. Her pulse is quite low (she is not an avid exerciser and not on any heart meds) - refuses flu shot today - RTC in 6 months, but likely sooner for repeat labs  Component     Latest Ref Rng & Units 12/12/2015  TSH     0.35 - 4.50 uIU/mL 3.60  T4,Free(Direct)     0.60 - 1.60 ng/dL 1.470.78  TSI     <829<140 % baseline <89  Triiodothyronine,Free,Serum     2.3 - 4.2 pg/mL 2.8  All labs normal.  Per patient's preference, communication should be through the Spanish interpreter line.  Carlus Pavlovristina Kinzee Happel, MD PhD [facility]

## 2015-12-16 ENCOUNTER — Encounter: Payer: Self-pay | Admitting: Internal Medicine

## 2015-12-16 LAB — THYROID STIMULATING IMMUNOGLOBULIN

## 2015-12-18 ENCOUNTER — Other Ambulatory Visit: Payer: Self-pay

## 2016-05-27 ENCOUNTER — Ambulatory Visit: Payer: Self-pay | Admitting: Family Medicine

## 2016-06-10 ENCOUNTER — Telehealth: Payer: Self-pay | Admitting: Internal Medicine

## 2016-06-10 ENCOUNTER — Ambulatory Visit: Payer: Self-pay | Admitting: Internal Medicine

## 2016-06-10 NOTE — Telephone Encounter (Signed)
3mo

## 2016-06-10 NOTE — Telephone Encounter (Signed)
Patient no showed today's appt. Please advise on how to follow up. °A. No follow up necessary. °B. Follow up urgent. Contact patient immediately. °C. Follow up necessary. Contact patient and schedule visit in ___ days. °D. Follow up advised. Contact patient and schedule visit in ____weeks. ° °

## 2016-09-10 ENCOUNTER — Encounter: Payer: Self-pay | Admitting: Family Medicine

## 2016-09-10 ENCOUNTER — Ambulatory Visit: Payer: Self-pay | Attending: Family Medicine | Admitting: Family Medicine

## 2016-09-10 VITALS — BP 114/75 | HR 55 | Temp 98.2°F | Resp 18 | Ht 63.0 in | Wt 177.2 lb

## 2016-09-10 DIAGNOSIS — N3000 Acute cystitis without hematuria: Secondary | ICD-10-CM | POA: Insufficient documentation

## 2016-09-10 DIAGNOSIS — R1030 Lower abdominal pain, unspecified: Secondary | ICD-10-CM | POA: Insufficient documentation

## 2016-09-10 DIAGNOSIS — M545 Low back pain: Secondary | ICD-10-CM | POA: Insufficient documentation

## 2016-09-10 DIAGNOSIS — M549 Dorsalgia, unspecified: Secondary | ICD-10-CM

## 2016-09-10 LAB — POCT URINALYSIS DIPSTICK
BILIRUBIN UA: NEGATIVE
Glucose, UA: NEGATIVE
KETONES UA: NEGATIVE
Nitrite, UA: NEGATIVE
PROTEIN UA: NEGATIVE
Spec Grav, UA: 1.01 (ref 1.010–1.025)
Urobilinogen, UA: 0.2 E.U./dL
pH, UA: 6.5 (ref 5.0–8.0)

## 2016-09-10 LAB — POCT URINE PREGNANCY: PREG TEST UR: NEGATIVE

## 2016-09-10 MED ORDER — NITROFURANTOIN MONOHYD MACRO 100 MG PO CAPS: 100.0000 mg | ORAL_CAPSULE | Freq: Two times a day (BID) | ORAL | 0 refills | Status: DC

## 2016-09-10 MED ORDER — ACETAMINOPHEN 500 MG PO TABS: 1000.0000 mg | ORAL_TABLET | Freq: Three times a day (TID) | ORAL | 0 refills | Status: DC | PRN

## 2016-09-10 NOTE — Progress Notes (Signed)
Patient is here for lower back pain & lower abdomen pain

## 2016-09-10 NOTE — Progress Notes (Signed)
   Subjective:  Patient ID: Regina Calhoun, female    DOB: 1970-10-28  Age: 46 y.o. MRN: [id number]  CC: Establish Care   HPI Regina Calhoun presents for complaints  pain in the lower abdomen She has had symptoms for 2 weeks. Reports stabbing like pain in the abodmen and lower back. She denies any  back pain, fever and vaginal discharge. Patient does not have a history of recurrent UTI.  Patient does not have a history of pyelonephritis. She reports similar symptoms 1 year ago. She also complaints of amenorrhea in July and June. She does report menstrual period in August. LPM 09/01/16. Reports taking naprosyn for symptoms.      No outpatient prescriptions prior to visit.   No facility-administered medications prior to visit.     ROS Review of Systems  Constitutional: Negative.   Eyes: Negative.   Respiratory: Negative.   Cardiovascular: Negative.   Gastrointestinal: Positive for abdominal pain.  Musculoskeletal: Positive for back pain.  Skin: Negative.   Psychiatric/Behavioral: Negative.         Objective:  BP 114/75 (BP Location: Left Arm, Patient Position: Sitting, Cuff Size: Normal)   Pulse (!) 55   Temp 98.2 F (36.8 C) (Oral)   Resp 18   Ht 5\' 3"  (1.6 m)   Wt 177 lb 3.2 oz (80.4 kg)   LMP 09/01/2016   SpO2 98%   BMI 31.39 kg/m   BP/Weight 09/10/2016 12/12/2015 06/11/2015  Systolic BP 114 120 122  Diastolic BP 75 84 80  Wt. (Lbs) 177.2 167.8 171  BMI 31.39 30.2 30.3     Physical Exam  Constitutional: She appears well-developed and well-nourished.  Cardiovascular: Normal rate, regular rhythm, normal heart sounds and intact distal pulses.   Pulmonary/Chest: Effort normal and breath sounds normal.  Abdominal: Soft. Bowel sounds are normal. There is tenderness (lower). There is CVA tenderness.  Skin: Skin is warm and dry.  Nursing note and vitals reviewed.    Assessment & Plan:   Problem List Items Addressed This Visit    None    Visit Diagnoses     Acute cystitis without hematuria    -  Primary   Relevant Medications   nitrofurantoin, macrocrystal-monohydrate, (MACROBID) 100 MG capsule   Lower abdominal pain       Relevant Orders   Urinalysis Dipstick (Completed)   POCT urine pregnancy (Completed)   CVA tenderness       Relevant Medications   acetaminophen (TYLENOL) 500 MG tablet      Meds ordered this encounter  Medications  . nitrofurantoin, macrocrystal-monohydrate, (MACROBID) 100 MG capsule    Sig: Take 1 capsule (100 mg total) by mouth 2 (two) times daily.    Dispense:  10 capsule    Refill:  0    Order Specific Question:   Supervising Provider    Answer:   Quentin AngstJEGEDE, OLUGBEMIGA E [id number]]  . acetaminophen (TYLENOL) 500 MG tablet    Sig: Take 2 tablets (1,000 mg total) by mouth every 8 (eight) hours as needed for moderate pain.    Dispense:  30 tablet    Refill:  0    Order Specific Question:   Supervising Provider    Answer:   Quentin AngstJEGEDE, OLUGBEMIGA E [id number]]    Follow-up: Return if symptoms worsen or fail to improve.   Lizbeth BarkMandesia R Jonhatan Hearty FNP

## 2016-09-10 NOTE — Patient Instructions (Signed)
Infección de las vías urinarias en los adultos  (Urinary Tract Infection, Adult)  Una infección urinaria (IU) es una infección en cualquier parte de las vías urinarias, que incluyen los riñones, los uréteres, la vejiga y la uretra. Estos órganos fabrican, almacenan y eliminan la orina del organismo. La IU puede ser una infección de la vejiga (cistitis) o infección de los riñones (pielonefritis).  CAUSAS  Esta infección puede ser causada por hongos, virus o bacterias. Las bacterias son las causas más comunes de las IU. Esta afección también puede ser provocada por no vaciar la vejiga por completo durante la micción en repetidas ocasiones.  FACTORES DE RIESGO  Es más probable que esta afección se manifieste si:  · Usted ignora la necesidad de orinar o retiene la orina durante largos períodos.  · No vacía la vejiga completamente durante la micción.  · Se limpia de atrás hacia adelante después de orinar o defecar, en el caso de que sea mujer.  · Está circuncidado, en el caso de que sea varón.  · Tiene estreñimiento.  · Tiene colocada una sonda urinaria permanente.  · Tiene debilitado el sistema de defensa (inmunitario) del cuerpo.  · Tiene una enfermedad que afecta los intestinos, los riñones o la vejiga.  · Tiene diabetes.  · Toma antibióticos con frecuencia o durante largos períodos, y los antibióticos ya no resultan eficaces para combatir algunos tipos de infecciones (resistencia a los antibióticos).  · Toma medicamentos que le irritan las vías urinarias.  · Está expuesto a sustancias químicas que le irritan las vías urinarias.  · Es mujer.  SÍNTOMAS  Los síntomas de esta afección incluyen lo siguiente:  · Fiebre.  · Micción frecuente o eliminación de pequeñas cantidades de orina con frecuencia.  · Necesidad urgente de orinar.  · Ardor o dolor al orinar.  · Orina con mal olor u olor atípico.  · Orina turbia.  · Dolor en la parte baja del abdomen o en la espalda.  · Dificultad para orinar.  · Sangre en la  orina.  · Vómitos o más apetito de lo normal.  · Diarrea o dolor abdominal.  · Secreción vaginal, si es mujer.  DIAGNÓSTICO  Esta afección se diagnostica mediante sus antecedentes médicos y un examen físico. También deberá proporcionar una muestra de orina para realizar análisis. Podrán indicarle otros estudios, por ejemplo:  · Análisis de sangre.  · Pruebas de detección de enfermedades de transmisión sexual (ETS).  Si ha tenido más de una IU, se pueden hacer estudios de diagnóstico por imágenes o una citoscopia para determinar la causa de las infecciones.  TRATAMIENTO  El tratamiento de esta afección suele incluir una combinación de dos o más de los siguientes:  · Antibióticos.  · Otros medicamentos para tratar las causas menos frecuentes de infección urinaria.  · Medicamentos de venta libre para aliviar el dolor.  · Consumo de la cantidad necesaria de agua para mantenerse hidratado.  INSTRUCCIONES PARA EL CUIDADO EN EL HOGAR  · Tome los medicamentos de venta libre y los recetados solamente como se lo haya indicado el médico.  · Si le recetaron un antibiótico, tómelo como se lo haya indicado el médico. No deje de tomar los antibióticos aunque comience a sentirse mejor.  · Evite el alcohol, la cafeína, el té y las bebidas gaseosas. Estas sustancias pueden irritar la vejiga.  · Beba suficiente líquido para mantener la orina clara o de color amarillo pálido.  · Concurra a todas las visitas de control como se   lo haya indicado el médico. Esto es importante.  · Asegúrese de lo siguiente:  ? Vaciar la vejiga con frecuencia y en su totalidad. No contener la orina durante largos períodos.  ? Vaciar la vejiga antes y después de tener relaciones sexuales.  ? Limpiar de adelante hacia atrás después de defecar, si es mujer. Usar cada trozo de papel una vez cuando se limpie.    SOLICITE ATENCIÓN MÉDICA SI:  · Siente dolor en la espalda.  · Tiene fiebre.  · Siente náuseas o vomita.  · Los síntomas no mejoran después de 3 días de  tratamiento.  · Los síntomas desaparecen y luego reaparecen.    SOLICITE ATENCIÓN MÉDICA DE INMEDIATO SI:  · Siente dolor intenso en la espalda o en la zona inferior del abdomen.  · Tiene vómitos y no puede tragar medicamentos ni agua.    Esta información no tiene como fin reemplazar el consejo del médico. Asegúrese de hacerle al médico cualquier pregunta que tenga.  Document Released: 10/28/2004 Document Revised: 05/12/2015 Document Reviewed: 12/09/2014  Elsevier Interactive Patient Education © 2017 Elsevier Inc.

## 2016-10-15 ENCOUNTER — Ambulatory Visit: Payer: Self-pay | Admitting: Internal Medicine

## 2016-11-08 ENCOUNTER — Encounter: Payer: Self-pay | Admitting: Family Medicine

## 2016-11-08 ENCOUNTER — Ambulatory Visit: Payer: Self-pay | Attending: Family Medicine | Admitting: Family Medicine

## 2016-11-08 VITALS — BP 98/60 | HR 58 | Temp 98.3°F | Resp 18 | Ht 63.0 in | Wt 173.0 lb

## 2016-11-08 DIAGNOSIS — H547 Unspecified visual loss: Secondary | ICD-10-CM | POA: Insufficient documentation

## 2016-11-08 DIAGNOSIS — Z23 Encounter for immunization: Secondary | ICD-10-CM | POA: Insufficient documentation

## 2016-11-08 DIAGNOSIS — R7303 Prediabetes: Secondary | ICD-10-CM | POA: Insufficient documentation

## 2016-11-08 DIAGNOSIS — B351 Tinea unguium: Secondary | ICD-10-CM | POA: Insufficient documentation

## 2016-11-08 DIAGNOSIS — Z79899 Other long term (current) drug therapy: Secondary | ICD-10-CM | POA: Insufficient documentation

## 2016-11-08 DIAGNOSIS — Z0000 Encounter for general adult medical examination without abnormal findings: Secondary | ICD-10-CM | POA: Insufficient documentation

## 2016-11-08 DIAGNOSIS — Z8669 Personal history of other diseases of the nervous system and sense organs: Secondary | ICD-10-CM

## 2016-11-08 DIAGNOSIS — E0500 Thyrotoxicosis with diffuse goiter without thyrotoxic crisis or storm: Secondary | ICD-10-CM | POA: Insufficient documentation

## 2016-11-08 DIAGNOSIS — L600 Ingrowing nail: Secondary | ICD-10-CM | POA: Insufficient documentation

## 2016-11-08 LAB — POCT GLYCOSYLATED HEMOGLOBIN (HGB A1C): Hemoglobin A1C: 5.7

## 2016-11-08 NOTE — Progress Notes (Signed)
Patient is here for physical   Patient has nails issues & cant sleep at nights

## 2016-11-08 NOTE — Patient Instructions (Addendum)
Plan de alimentacin para la prediabetes (Prediabetes Eating Plan) La prediabetes, tambin llamada intolerancia a la glucosa o alteracin de la glucosa en ayunas, es una afeccin que eleva los niveles de azcar en la sangre (glucemia) por encima de lo normal. Seguir una dieta saludable puede ayudar a mantener la prediabetes bajo control, y tambin reduce el riesgo de tener diabetes tipo2 y cardiopata, que es ms alto en las personas que tienen esta afeccin. Junto con la actividad fsica habitual, una dieta saludable:  Promueve la prdida de peso.  Ayuda a controlar los niveles de azcar en la sangre.  Ayuda a mejorar la forma en que el organismo usa la insulina. QU DEBO SABER ACERCA DE ESTE PLAN DE ALIMENTACIN?  Use el ndice glucmico (IG) para planificar las comidas. El ndice le informa con qu rapidez un alimento elevar su nivel de azcar en la sangre. Elija los alimentos con bajo IG. Estos tardan ms tiempo en subir el nivel de azcar en la sangre.  Preste mucha atencin a la cantidad de hidratos de carbono que hay en los alimentos que consume. Los hidratos de carbono aumentan los niveles de azcar en la sangre.  Lleve un registro de la cantidad de caloras que ingiere. Ingerir la cantidad correcta de caloras lo ayudar a alcanzar un peso saludable. Bajar alrededor del 7por ciento del peso inicial puede ayudar a evitar la diabetes tipo2.  Tal vez deba seguir una dieta mediterrnea. Esta incluye una gran cantidad de verduras, carnes magras o pescado, cereales integrales, frutas, as como aceites y grasas saludables.  QU ALIMENTOS PUEDO COMER? Cereales Cereales integrales, como panes, galletas, cereales y pastas de salvado o integrales. Avena sin azcar. Trigo burgol. Cebada. Quinua. Arroz integral. Tortillas o tacos de harina de maz o de salvado. Verduras [place]ch[place]. Espinaca. Guisantes. Remolachas. Coliflor. Repollo. Brcoli. Zanahorias. Tomates. [place]aza. Berenjena. Hierbas.  Pimientos. Cebollas. Pepinos. Repollitos de Bruselas. Frutas Frutos rojos. Bananas. Manzanas. Naranjas. Uvas. Papaya. Mango. Granada. Kiwi. Pomelo. Cerezas. Carnes y otras fuentes de protenas Mariscos. Carnes magras, entre ellas, pollo y pavo o cortes magros de carne de cerdo y de vaca. Tofu. Huevos. Los frutos secos. Frijoles. Lcteos Productos lcteos descremados o semidescremados, como yogur, queso cottage y queso. Bebidas Agua. T. Caf. Gaseosas sin azcar o dietticas. Agua de Seltz. Leche. Productos alternativos de la leche, como leche de soja o de almendra. Condimentos Mostaza. Salsa de pepinillos. [place] con bajo contenido de grasa y de azcar. Salsa barbacoa con bajo contenido de grasa y de azcar. Mayonesa sin grasa o con bajo contenido de grasa. Dulces y postres Bud[place] sin azcar o con bajo contenido de grasa. Helados y otros dulces congelados sin azcar o con bajo contenido de grasa. Grasas y aceites [place]. Nueces. Aceite de oliva. Los artculos mencionados arriba pueden no ser una lista completa de las bebidas o los alimentos recomendados. Comunquese con el nutricionista para conocer ms opciones. QU ALIMENTOS NO SE RECOMIENDAN? Cereales Productos a base de harina y de harina blanca refinada, como panes, pastas, bocadillos y cereales. Bebidas Bebidas azucaradas, como t helado y gaseosas con azcar. Dulces y postres Productos de panadera, como tortas, madalenas, pasteles, galletitas y tarta de queso. Los artculos mencionados arriba pueden no ser una lista completa de las bebidas y los alimentos que se deben evitar. Comunquese con el nutricionista para obtener ms informacin. Esta informacin no tiene como fin reemplazar el consejo del mdico. Asegrese de hacerle al mdico cualquier pregunta que tenga. Document Released: 10/09/2014 Document Revised: 10/09/2014 Document Reviewed: 02/13/2014 Elsevier   Interactive Patient Education  2017 Elsevier  Inc.    Infeccin por hongos en las uas (Fungal Nail Infection) La infeccin por hongos en las uas es una infeccin por hongos frecuente en las uas de los pies o de las [place]. Este trastorno [employer] uas de los pies con ms frecuencia que las uas de las manos. Ms de Neomia Dear ua puede infectarse. Esta afeccin puede transmitirse de [place] persona a otra (es  contagiosa). CAUSAS La causa de esta afeccin es un hongo. Existen distintos tipos de hongos que pueden causar la infeccin. Estos hongos son ms frecuentes en las zonas hmedas y clidas. Si las manos o los pies entran en contacto con los hongos, se pueden introducir en una ruptura de las uas de las manos o de los pies y Games developer infeccin. FACTORES DE RIESGO Los siguientes factores pueden hacer que usted sea propenso a sufrir esta afeccin:  Ser varn.  Tener diabetes.  Ser Neomia Dear persona de edad avanzada.  Convivir con alguien que tiene hongos.  Caminar descalzo en zonas donde proliferan hongos, como duchas o vestuarios.  Tener mala circulacin.  Usar zapatos y calcetines que [employer].  Tener pie de atleta.  Tener una ua lastimada o antecedentes recientes de una ciruga de uas.  Tener psoriasis.  Debilitamiento del sistema de defensa del cuerpo (sistema inmunitario). SNTOMAS Los sntomas de esta afeccin incluyen lo siguiente:  Cyndia Diver plida sobre la ua.  Engrosamiento de la ua.  Una ua que se torna amarilla o [place].  Bordes de las uas rugosos o quebradizos.  Una ua que se cae.  Una ua que se ha desprendido del lecho ungueal. DIAGNSTICO Esta afeccin se diagnostica mediante un examen fsico. El mdico podr tomar una muestra de la ua para examinarla y Engineer, manufacturing si tiene hongos. TRATAMIENTO Las infecciones leves no necesitan tratamiento. Si tiene [employer] uas, el tratamiento puede incluir lo siguiente:  Medicamentos antimicticos por va oral. Deber tomar  los medicamentos durante algunas semanas o meses y no ver los resultados hasta despus de un largo [place]. Estos medicamentos pueden tener efectos secundarios. Consulte al [employer] a los que debe estar atento.  Cremas y esmaltes para uas antimicticos. Se pueden usar junto con los medicamentos antimicticos que se administran por va oral.  Tratamiento lser de las uas.  Ciruga para extirpar la ua. Esto puede ser [employer] casos ms graves de infecciones. El tratamiento es muy largo y la infeccin Metallurgist. INSTRUCCIONES PARA EL CUIDADO EN EL HOGAR Medicamentos  Tome o aplquese los medicamentos de venta libre y Science writer se lo haya indicado el mdico.  Consulte al mdico sobre el uso de pomadas mentoladas para las uas de [place]. Estilo de vida  No comparta elementos personales como toallas o cortauas.  Crtese las uas con frecuencia.  Lvese y squese las manos y los pies todos [place].  Use calcetines absorbentes y cmbiese los calcetines con frecuencia.  Use un tipo de calzado que permita que el aire [place], como sandalias o zapatillas de lona. Deseche los zapatos viejos.  Use guantes de goma si est trabajando con sus manos en lugares mojados.  No camine descalzo en duchas o vestuarios.  No concurra a un saln de cosmtica de uas si no usan instrumentos limpios.  No use uas artificiales. Instrucciones generales  Concurra a todas las visitas de control como se lo haya indicado el mdico. Esto es importante.  Aplquese  polvo antimictico en los pies y en los zapatos. SOLICITE ATENCIN MDICA SI: La infeccin no mejora o si empeora despus de varios meses. Esta informacin no tiene Theme park manager el consejo del mdico. Asegrese de hacerle al mdico cualquier pregunta que tenga. Document Released: 10/28/2004 Document Revised: 05/12/2015 Document Reviewed: 07/22/2014 Elsevier Interactive Patient Education   [employer].

## 2016-11-08 NOTE — Progress Notes (Signed)
Subjective:   Patient ID: Regina Calhoun, female    DOB: May 09, 1970, 46 y.o.   MRN: [id number]  Chief Complaint  Patient presents with  . Annual Exam   HPI Regina Calhoun 46 y.o. female presents for comprehensive wellness examination. She reports PMH of Grave's Disease. She denies any family history of HTN. Heart disease, Cancer. SHe does reports family history of DM-MGM. She denies anychest pain, chest pressure/discomfort, claudication, dyspnea, fatigue, lower extremity edema, near-syncope, palpitations and syncope.  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 She repost her last opthalmologic exam was 6 years ago. She complaints of dark discoloration to bilateral fingernail and toenails. Onset was 1 year ago. She denies trying anything for symptoms. She reports averaging 5 hours of sleep per night. SHe denies any anxiety/depression. She does report some occasional stress. Denies symptoms of all other pertinent systems.   Past Medical History:  Diagnosis Date  . Graves disease     No past surgical history on file.  Family History  Problem Relation Age of Onset  . Polycystic ovary syndrome Daughter   . Clotting disorder Paternal Grandmother   . Hypothyroidism Mother   . Diabetes Maternal Grandmother     Social History   Social History  . Marital status: Married    Spouse name: N/A  . Number of children: N/A  . Years of education: N/A   Occupational History  . Not on file.   Social History Main Topics  . Smoking status: Never Smoker  . Smokeless tobacco: Never Used  . Alcohol use No  . Drug use: No  . Sexual activity: Not Currently   Other Topics Concern  . Not on file   Social History Narrative  . No narrative on file    Outpatient Medications Prior to Visit  Medication Sig Dispense Refill  . acetaminophen (TYLENOL) 500 MG tablet Take 2 tablets (1,000 mg total) by mouth every 8 (eight) hours as needed for moderate  pain. 30 tablet 0  . nitrofurantoin, macrocrystal-monohydrate, (MACROBID) 100 MG capsule Take 1 capsule (100 mg total) by mouth 2 (two) times daily. 10 capsule 0   No facility-administered medications prior to visit.     No Known Allergies  Review of Systems  Constitutional: Negative.   HENT: Negative.   Eyes:       History of visual impairment  Respiratory: Negative.   Cardiovascular: Negative.   Gastrointestinal: Negative.   Genitourinary: Negative.   Musculoskeletal: Negative.   Skin: Negative.        Nail discoloration  Neurological: Negative.       Objective:    Physical Exam  Constitutional: She is oriented to person, place, and time. She appears well-developed and well-nourished.  HENT:  Head: Normocephalic and atraumatic.  Right Ear: External ear normal.  Left Ear: External ear normal.  Nose: Nose normal.  Mouth/Throat: Oropharynx is clear and moist.  Eyes: Pupils are equal, round, and reactive to light. Conjunctivae and EOM are normal.  Neck: Normal range of motion. Neck supple. No JVD present.  Cardiovascular: Normal rate, regular rhythm, normal heart sounds and intact distal pulses.   Pulmonary/Chest: Effort normal and breath sounds normal.  Abdominal: Soft. Bowel sounds are normal. There is no tenderness.  Musculoskeletal: Normal range of motion.       Right hand: She exhibits normal capillary refill. Normal sensation noted.       Left hand: She exhibits normal capillary refill. Normal sensation noted.  Lymphadenopathy:    She has no cervical adenopathy.  Neurological: She is alert and oriented to person, place, and time. She has normal reflexes.  Skin: Skin is warm and dry. No cyanosis.  Darkened discoloration to bilateral hand and feet with thickened nails. Great toenail to right nail is ingrown and thickened.   Psychiatric: She has a normal mood and affect. She expresses no homicidal and no suicidal ideation. She expresses no suicidal plans and no  homicidal plans.  Nursing note and vitals reviewed.   BP 98/60 (BP Location: Left Arm, Patient Position: Sitting, Cuff Size: Normal)   Pulse (!) 58   Temp 98.3 F (36.8 C) (Oral)   Resp 18   Ht  (1.6 m)   Wt 173 lb (78.5 kg)   SpO2 97%   BMI 30.65 kg/m  Wt Readings from Last 3 Encounters:  11/08/16 173 lb (78.5 kg)  09/10/16 177 lb 3.2 oz (80.4 kg)  12/12/15 167 lb 12.8 oz (76.1 kg)    Immunization History  Administered Date(s) Administered  . Influenza,inj,Quad PF,6+ Mos 11/15/2013, 11/08/2016    Lab Results  Component Value Date   TSH 3.60 12/12/2015   Lab Results  Component Value Date   WBC 7.7 05/05/2015   HGB 14.0 05/05/2015   HCT 41.7 05/05/2015   MCV 85.8 05/05/2015   PLT 355 05/05/2015   Lab Results  Component Value Date   NA 139 05/05/2015   K 4.1 05/05/2015   CO2 23 05/05/2015   GLUCOSE 103 (H) 05/05/2015   BUN 9 05/05/2015   CREATININE 0.69 05/05/2015   BILITOT 0.5 11/15/2013   ALKPHOS 100 11/15/2013   AST 26 11/15/2013   ALT 28 11/15/2013   PROT 6.9 11/15/2013   ALBUMIN 4.0 11/15/2013   CALCIUM 9.1 05/05/2015   Lab Results  Component Value Date   CHOL 166 11/23/2007   Lab Results  Component Value Date   HDL 50 11/23/2007   Lab Results  Component Value Date   LDLCALC 104 (H) 11/23/2007   Lab Results  Component Value Date   TRIG 58 11/23/2007   Lab Results  Component Value Date   CHOLHDL 3.3 Ratio 11/23/2007   Lab Results  Component Value Date   HGBA1C 5.7 11/08/2016       Assessment & Plan:   1. Annual physical exam  - POCT glycosylated hemoglobin (Hb A1C) - CMP and Liver - CBC - TSH - Lipid Panel - VITAMIN D 25 Hydroxy (Vit-D Deficiency, Fractures)  2. Prediabetes Hgba1c 5.7 in office. Recommend dietary changes and incorporating exercise.  3. Ingrown right big toenail  - Ambulatory referral to Podiatry  4. Onychomycosis Plan to start on anti-fungal pending results of liver function and CBC labs. -  Ambulatory referral to Podiatry  5. Needs flu shot  - Flu Vaccine QUAD 6+ mos PF IM (Fluarix Quad PF)  6. History of visual impairment Vision acuity  screen  - Ambulatory referral to Ophthalmology    Follow up: Return in about 3 months (around 02/08/2017) for Pre-Diabetes.    Arrie Senate, FNP

## 2016-11-09 LAB — VITAMIN D 25 HYDROXY (VIT D DEFICIENCY, FRACTURES): Vit D, 25-Hydroxy: 34.4 ng/mL (ref 30.0–100.0)

## 2016-11-09 LAB — CMP AND LIVER
ALK PHOS: 91 IU/L (ref 39–117)
ALT: 37 IU/L — AB (ref 0–32)
AST: 27 IU/L (ref 0–40)
Albumin: 4.5 g/dL (ref 3.5–5.5)
BILIRUBIN, DIRECT: 0.07 mg/dL (ref 0.00–0.40)
BUN: 14 mg/dL (ref 6–24)
Bilirubin Total: 0.3 mg/dL (ref 0.0–1.2)
CO2: 25 mmol/L (ref 20–29)
Calcium: 10.2 mg/dL (ref 8.7–10.2)
Chloride: 102 mmol/L (ref 96–106)
Creatinine, Ser: 0.76 mg/dL (ref 0.57–1.00)
GFR calc Af Amer: 109 mL/min/{1.73_m2} (ref >59)
GFR calc non Af Amer: 94 mL/min/{1.73_m2} (ref >59)
Glucose: 102 mg/dL — ABNORMAL HIGH (ref 65–99)
Potassium: 4.8 mmol/L (ref 3.5–5.2)
Sodium: 141 mmol/L (ref 134–144)
Total Protein: 7.7 g/dL (ref 6.0–8.5)

## 2016-11-09 LAB — LIPID PANEL
CHOLESTEROL TOTAL: 183 mg/dL (ref 100–199)
Chol/HDL Ratio: 3.6 ratio (ref 0.0–4.4)
HDL: 51 mg/dL (ref >39)
LDL CALC: 111 mg/dL — AB (ref 0–99)
Triglycerides: 107 mg/dL (ref 0–149)
VLDL Cholesterol Cal: 21 mg/dL (ref 5–40)

## 2016-11-09 LAB — CBC
HEMATOCRIT: 44.3 % (ref 34.0–46.6)
HEMOGLOBIN: 14.1 g/dL (ref 11.1–15.9)
MCH: 28 pg (ref 26.6–33.0)
MCHC: 31.8 g/dL (ref 31.5–35.7)
MCV: 88 fL (ref 79–97)
Platelets: 368 10*3/uL (ref 150–379)
RBC: 5.04 x10E6/uL (ref 3.77–5.28)
RDW: 14.3 % (ref 12.3–15.4)
WBC: 9.1 10*3/uL (ref 3.4–10.8)

## 2016-11-09 LAB — TSH: TSH: 3.38 u[IU]/mL (ref 0.450–4.500)

## 2016-11-11 ENCOUNTER — Other Ambulatory Visit: Payer: Self-pay | Admitting: Family Medicine

## 2016-11-11 ENCOUNTER — Telehealth: Payer: Self-pay

## 2016-11-11 DIAGNOSIS — E7800 Pure hypercholesterolemia, unspecified: Secondary | ICD-10-CM

## 2016-11-11 MED ORDER — PRAVASTATIN SODIUM 20 MG PO TABS: 20.0000 mg | ORAL_TABLET | Freq: Every day | ORAL | 2 refills | Status: DC

## 2016-11-11 NOTE — Telephone Encounter (Signed)
-----   Message from Lizbeth Bark, [place] sent at 11/11/2016  1:41 PM EDT ----- Kidney function normal Liver function normal Thyroid function normal Labs that evaluated your blood cells, fluid and electrolyte balance are normal. No signs of anemia, acute infection, or inflammation present. Vitamin D level is normal.  Vitamin D helps to keep bones strong.  LDL cholesterol level is elevated. High cholesterol can increase your risk of heart disease overtime. You will be prescribed pravastatin.  Start eating a diet low in saturated fat. Limit your intake of fried foods, red meats, and whole milk. Increase physical activity.  Recommend follow up in 3 months.

## 2016-11-11 NOTE — Telephone Encounter (Signed)
CMA call regarding lab results   Patient did not answer but left a VM stating the reason of the call & to call back  

## 2016-11-11 NOTE — Telephone Encounter (Signed)
Patient return CMA call regarding lab results   Patient verify DOB   Patient was aware and understood

## 2016-11-19 ENCOUNTER — Ambulatory Visit: Payer: Self-pay

## 2016-12-03 ENCOUNTER — Ambulatory Visit: Payer: Self-pay | Admitting: Internal Medicine

## 2017-02-11 ENCOUNTER — Ambulatory Visit: Payer: Self-pay | Attending: Family Medicine | Admitting: Family Medicine

## 2017-02-11 ENCOUNTER — Other Ambulatory Visit: Payer: Self-pay | Admitting: Family Medicine

## 2017-02-11 ENCOUNTER — Encounter: Payer: Self-pay | Admitting: Family Medicine

## 2017-02-11 VITALS — BP 100/63 | HR 53 | Temp 98.6°F | Resp 18 | Ht 64.0 in | Wt 173.0 lb

## 2017-02-11 DIAGNOSIS — Z87898 Personal history of other specified conditions: Secondary | ICD-10-CM

## 2017-02-11 DIAGNOSIS — M5441 Lumbago with sciatica, right side: Secondary | ICD-10-CM

## 2017-02-11 DIAGNOSIS — Z23 Encounter for immunization: Secondary | ICD-10-CM

## 2017-02-11 DIAGNOSIS — E7800 Pure hypercholesterolemia, unspecified: Secondary | ICD-10-CM

## 2017-02-11 DIAGNOSIS — Z79899 Other long term (current) drug therapy: Secondary | ICD-10-CM | POA: Insufficient documentation

## 2017-02-11 DIAGNOSIS — R7303 Prediabetes: Secondary | ICD-10-CM | POA: Insufficient documentation

## 2017-02-11 DIAGNOSIS — E785 Hyperlipidemia, unspecified: Secondary | ICD-10-CM | POA: Insufficient documentation

## 2017-02-11 LAB — GLUCOSE, POCT (MANUAL RESULT ENTRY): POC Glucose: 114 mg/dl — AB (ref 70–99)

## 2017-02-11 LAB — POCT GLYCOSYLATED HEMOGLOBIN (HGB A1C): Hemoglobin A1C: 5.6

## 2017-02-11 MED ORDER — NAPROXEN 500 MG PO TABS
500.0000 mg | ORAL_TABLET | Freq: Two times a day (BID) | ORAL | 0 refills | Status: DC
Start: 2017-02-11 — End: 2017-08-26

## 2017-02-11 NOTE — Progress Notes (Signed)
   Subjective:  Patient ID: Regina Calhoun, female    DOB: 01/18/1971  Age: 47 y.o. MRN: [id number]  CC: Pre-Diabetes   HPI Regina Calhoun presents for HLD and pre-DM follow up. Interpreter services used. Pre-DM: for prediabetes follow up. Symptoms: none. Evaluation to date has been included: hemoglobin A1C.Treatment to date: more intensive attention to diet. HLD: She is not adherent to lower fat diet. Cardiac symptoms none. Cardiovascular risk factors: sedentary lifestyle. Back pain: Onset 2 weeks ago. Denies any NKI. Associated symptoms occasional paresthesias to right leg and shoulder. She reports taking OTC ointment for symptoms with minimal relief of symptoms.    Outpatient Medications Prior to Visit  Medication Sig Dispense Refill  . pravastatin (PRAVACHOL) 20 MG tablet Take 1 tablet (20 mg total) by mouth daily. 30 tablet 2   No facility-administered medications prior to visit.     ROS Review of Systems  Constitutional: Negative.   Respiratory: Negative.   Cardiovascular: Negative.   Gastrointestinal: Negative.   Skin: Negative.    Objective:  BP 100/63 (BP Location: Left Arm, Patient Position: Sitting, Cuff Size: Normal)   Pulse (!) 53   Temp 98.6 F (37 C) (Oral)   Resp 18   Ht 5\' 4"  (1.626 m)   Wt 173 lb (78.5 kg)   SpO2 98%   BMI 29.70 kg/m   BP/Weight 02/11/2017 11/08/2016 09/10/2016  Systolic BP 100 98 114  Diastolic BP 63 60 75  Wt. (Lbs) 173 173 177.2  BMI 29.7 30.65 31.39     Physical Exam  Constitutional: She is oriented to person, place, and time. She appears well-developed and well-nourished.  Cardiovascular: Normal rate, regular rhythm, normal heart sounds and intact distal pulses.  Pulmonary/Chest: Effort normal and breath sounds normal.  Musculoskeletal:       Lumbar back: She exhibits pain.  Neurological: She is alert and oriented to person, place, and time. She has normal reflexes.  Skin: Skin is warm and dry.  Psychiatric: She has a  normal mood and affect.  Nursing note and vitals reviewed.   Assessment & Plan:   1. Acute bilateral low back pain with right-sided sciatica  - naproxen (NAPROSYN) 500 MG tablet; Take 1 tablet (500 mg total) by mouth 2 (two) times daily with a meal. For 5 days. Then take 1 tablet daily as needed with meals.  Dispense: 60 tablet; Refill: 0  2. Elevated LDL cholesterol level  - Lipid Panel  3. History of prediabetes Hgba1c 5.6 . Dietary and activity interventions discussed. - Glucose (CBG) - POCT glycosylated hemoglobin (Hb A1C)  4. Need for tetanus booster -[facility]    Follow-up: Return in about 6 months (around 08/11/2017), or if symptoms worsen or fail to improve, for HLD.   Regina BarkMandesia R Huxley Shurley FNP

## 2017-02-11 NOTE — Patient Instructions (Addendum)
Go to pharmacy for tetanus vaccine. Dolor de espalda en adultos (Back Pain, Adult) El dolor de espalda es muy frecuente. A menudo mejora con el tiempo. La causa del dolor de espalda generalmente no es peligrosa. La Harley-Davidsonmayora de las personas puede aprender a Runner, broadcasting/film/videomanejar el dolor de espalda por s mismas. CUIDADOS EN EL HOGAR Controle su dolor de espalda a fin de Public house managerdetectar algn cambio. Las siguientes indicaciones ayudarn a Psychologist, clinicalaliviar cualquier dolor que pueda sentir:  Materials engineerMantngase activo. Comience con caminatas cortas sobre superficies planas si es posible. Trate de caminar un poco ms cada da.  Haga ejercicios con regularidad tal como le indic el mdico. El ejercicio ayuda a que su espalda se cure ms rpidamente. Tambin ayuda a prevenir futuras lesiones al Kimberly-Clarkmantener los msculos fuertes y flexibles.  No se siente, conduzca ni permanezca de pie durante ms de 30 minutos.  No permanezca en la cama. Si hace reposo ms de 1 a 2 das, puede demorar su recuperacin.  Sea cuidadoso al inclinarse o levantar un objeto. Use una tcnica apropiada para levantar peso: ? Flexione las rodillas. ? Mantenga el objeto cerca del cuerpo. ? No gire.  Duerma sobre un NVR Inccolchn firme. Recustese sobre un costado y flexione las rodillas. Si se recuesta Fisher Scientificsobre la espalda, coloque una almohada debajo de las rodillas.  Tome los medicamentos solamente como se lo haya indicado el mdico.  Aplique hielo sobre la zona lesionada. ? Ponga el hielo en una bolsa plstica. ? Coloque una FirstEnergy Corptoalla entre la piel y la bolsa de hielo. ? Deje el hielo durante 20minutos, 2 a 3veces por da, durante los primeros 2 o 3das. Despus de eso, puede alternar entre compresas de hielo y Airline pilotcalor.  Evite sentir ansiedad o estrs. Encuentre maneras efectivas de lidiar con el estrs, Surveyor, miningcomo hacer ejercicio.  Mantenga un peso saludable. El peso excesivo ejerce tensin sobre la espalda. SOLICITE AYUDA SI:  Siente dolor que no se alivia con reposo o  medicamentos.  Siente cada vez ms dolor que se extiende a las piernas o los glteos.  El dolor no mejora en una semana.  Siente dolor por la noche.  Pierde peso.  Siente escalofros o fiebre. SOLICITE AYUDA DE INMEDIATO SI:  No puede controlar su materia fecal (heces) o el pis (orina).  Siente debilidad en las piernas o los brazos.  Siente prdida de la sensibilidad (adormecimiento) en las piernas o los brazos.  Tiene malestar estomacal (nuseas) o vomita.  Siente dolor de estmago (abdominal).  Siente que se desvanece (se desmaya). Esta informacin no tiene Theme park managercomo fin reemplazar el consejo del mdico. Asegrese de hacerle al mdico cualquier pregunta que tenga. Document Released: 08/03/2010 Document Revised: 02/08/2014 Document Reviewed: 05/22/2013 Elsevier Interactive Patient Education  [employer]2018 Elsevier Inc.

## 2017-02-12 LAB — LIPID PANEL
CHOL/HDL RATIO: 2.9 ratio (ref 0.0–4.4)
Cholesterol, Total: 143 mg/dL (ref 100–199)
HDL: 49 mg/dL (ref >39)
LDL CALC: 84 mg/dL (ref 0–99)
TRIGLYCERIDES: 51 mg/dL (ref 0–149)
VLDL Cholesterol Cal: 10 mg/dL (ref 5–40)

## 2017-02-21 ENCOUNTER — Other Ambulatory Visit: Payer: Self-pay | Admitting: Family Medicine

## 2017-02-21 ENCOUNTER — Telehealth: Payer: Self-pay | Admitting: *Deleted

## 2017-02-21 DIAGNOSIS — E7800 Pure hypercholesterolemia, unspecified: Secondary | ICD-10-CM

## 2017-02-21 MED ORDER — PRAVASTATIN SODIUM 20 MG PO TABS: 20.0000 mg | ORAL_TABLET | Freq: Every day | ORAL | 5 refills | Status: DC

## 2017-02-21 NOTE — Telephone Encounter (Signed)
Medical Assistant used [employer] to contact patient.  Interpreter Name: Salli RealJorge Interpreter #: [phone] Patient was not available, [employer] left patient a voicemail. Voicemail states to give a call back to [place] [facility] at [phone]. !!!Please inform patient of cholesterol being normal and to continue with pravastatin!!!

## 2017-02-21 NOTE — Telephone Encounter (Signed)
-----   Message from Lizbeth BarkMandesia R Hairston, FNP sent at 02/21/2017  9:15 AM EST ----- Cholesterol levels normal. Cont. pravastatin.

## 2017-02-23 ENCOUNTER — Telehealth: Payer: Self-pay | Admitting: Family Medicine

## 2017-02-23 NOTE — Telephone Encounter (Signed)
Pt. Returned nurse call and was informed of her Cholesterol results being normal.

## 2017-05-06 ENCOUNTER — Ambulatory Visit: Payer: Self-pay | Attending: Internal Medicine

## 2017-08-26 ENCOUNTER — Ambulatory Visit (INDEPENDENT_AMBULATORY_CARE_PROVIDER_SITE_OTHER): Payer: Self-pay | Admitting: Internal Medicine

## 2017-08-26 ENCOUNTER — Encounter: Payer: Self-pay | Admitting: Internal Medicine

## 2017-08-26 VITALS — BP 120/80 | HR 58 | Ht 64.0 in | Wt 169.8 lb

## 2017-08-26 DIAGNOSIS — E0500 Thyrotoxicosis with diffuse goiter without thyrotoxic crisis or storm: Secondary | ICD-10-CM

## 2017-08-26 LAB — T4, FREE: Free T4: 1.04 ng/dL (ref 0.60–1.60)

## 2017-08-26 LAB — TSH: TSH: 4.49 u[IU]/mL (ref 0.35–4.50)

## 2017-08-26 LAB — T3, FREE: T3, Free: 3.3 pg/mL (ref 2.3–4.2)

## 2017-08-26 NOTE — Progress Notes (Signed)
Patient ID: Regina Calhoun, female   DOB: Jul 06, 1970, 47 y.o.   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 Last visit 1 year and 8 months ago.  Patient is seen with the help of the Spanish interpreter. She does not have insurance (has [employer]).  Reviewed history: She was found to have overactive thyroid at a visit with PCP. They were checked b/c she c/o:  - bloating - irregular menses - skipping 1 mo starting in Aug >> she thought she was pregnant >> urine pregnancy test was negative  Reviewed patient's TFTs: Lab Results  Component Value Date   TSH 3.380 11/08/2016   TSH 3.60 12/12/2015   TSH 2.59 07/18/2015   TSH 2.49 06/11/2015   TSH 4.31 12/10/2014   TSH 2.46 07/19/2014   TSH 2.53 06/07/2014   TSH 0.09 (L) 02/26/2014   TSH 0.008 (L) 01/21/2014   TSH 0.010 (L) 11/30/2013   FREET4 0.78 12/12/2015   FREET4 0.88 07/18/2015   FREET4 0.93 06/11/2015   FREET4 0.75 12/10/2014   FREET4 0.70 07/19/2014   FREET4 0.73 06/07/2014   FREET4 0.84 02/26/2014   FREET4 2.04 (H) 01/21/2014   FREET4 2.01 (H) 11/30/2013    Lab Results  Component Value Date   TSI <89 12/12/2015   TSI 341 (H) 01/17/2014   We started MMI 5 mg 2x a day as the suspicion of her Graves'.  We could not do a thyroid uptake and scan due to price she has no insurance.  Subsequent TFTs have improved so we were able to stop methimazole in 06/2015.  She continues to have normal thyroid tests.  Pt denies: - feeling nodules in neck - hoarseness  - choking - SOB with lying down  She has: - occas. Dysphagia with water and food  Pt denies: - weight loss - heat intolerance - tremors - palpitations - anxiety - hyperdefecation She has irregular menses -delayed 2 to 3 months.  She c/o: - increased hair loss - bloating  She was on Biotin >> now off.  ROS: Constitutional: + see HPI Eyes: no blurry vision, no  xerophthalmia ENT: no sore throat, + see HPI Cardiovascular: no CP/no SOB/no palpitations/+ mild bilateral leg swelling Respiratory: no cough/no SOB/no wheezing Gastrointestinal: no N/no V/no D/no C/no acid reflux/+ bloating Musculoskeletal: no muscle aches/no joint aches Skin: no rashes, + hair loss Neurological: no tremors/no numbness/no tingling/no dizziness  I reviewed pt's medications, allergies, PMH, social hx, family hx, and changes were documented in the history of present illness. Otherwise, unchanged from my initial visit note.  No past medical history - other than described above.  No past surgical history.   History   Social History  . Marital Status: Married    Spouse Name: N/A    Number of Children: 2   Occupational History  . cleaning   Social History Main Topics  . Smoking status: Never Smoker   . Smokeless tobacco: Not on file  . Alcohol Use: No  . Drug Use: No   Current Outpatient Medications on File Prior to Visit  Medication Sig Dispense Refill  . naproxen (NAPROSYN) 500 MG tablet Take 1 tablet (500 mg total) by mouth 2 (two) times daily with a meal. For 5 days. Then take 1 tablet daily as needed with meals. 60 tablet 0  . pravastatin (PRAVACHOL) 20 MG tablet Take 1 tablet (20 mg total) by mouth daily. 30  tablet 5   No current facility-administered medications on file prior to visit.    No Known Allergies Family History  Problem Relation Age of Onset  . Polycystic ovary syndrome Daughter   . Clotting disorder Paternal Grandmother   . Hypothyroidism Mother   . Diabetes Maternal Grandmother    PE: BP 120/80   Pulse (!) 58   Ht 5\' 4"  (1.626 m)   Wt 169 lb 12.8 oz (77 kg)   LMP 08/04/2017   SpO2 94%   BMI 29.15 kg/m  Body mass index is 29.15 kg/m.  Wt Readings from Last 3 Encounters:  08/26/17 169 lb 12.8 oz (77 kg)  02/11/17 173 lb (78.5 kg)  11/08/16 173 lb (78.5 kg)   Constitutional: overweight, in NAD Eyes: PERRLA, EOMI, no  exophthalmos ENT: moist mucous membranes, + mild symmetric thyromegaly, no cervical lymphadenopathy Cardiovascular: RRR, No MRG Respiratory: CTA B Gastrointestinal: abdomen soft, NT, ND, BS+ Musculoskeletal: no deformities, strength intact in all 4 Skin: moist, warm, no rashes Neurological: no tremor with outstretched hands, DTR normal in all 4  ASSESSMENT: 1. 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  PLAN:  1. Patient with history of thyrotoxicosis, but without thyrotoxic symptoms this month: No weight loss, no heat intolerance, hyper defecation, palpitations, anxiety, tachycardia.  Initially she did have a very fine bilateral hand tremor, which is resolved.  She continues to have hair loss, which is not new.  We could not perform a thyroid uptake and scan due to cost, but I suspected Graves' disease based on the symptoms, the timeline of the disease in the absence of nodularity on thyroid exam.  Also, she responded very well to methimazole and we were able to stop this 2 years ago with subsequent normal TFTs, including in 12/2016.  - She remains asymptomatic, and has no complaints today except hair loss, bloating, slight lower extremity edema - At today's visit, we will check a TSH, free T4, free T3 and will add TSI antibodies to check the activity of her Graves' disease - No need to add a beta-blocker since she is not tachycardic or tremulous. - I will see her back in a year, but we discussed about contacting me sooner if she does develop hyperthyroid symptoms (explained what they are)  - time spent with the patient: 15 minutes, of which >50% was spent in obtaining information about her symptoms, reviewing her previous labs, evaluations, and treatments, counseling her about her condition (please see the discussed topics above), and developing a plan to further investigate and treat it.  Per patient's preference, communication should be through the Spanish interpreter line.  Office Visit  on 08/26/2017  Component Date Value Ref Range Status  . TSH 08/26/2017 4.49  0.35 - 4.50 uIU/mL Final  . Free T4 08/26/2017 1.04  0.60 - 1.60 ng/dL Final   Comment: Specimens from patients who are undergoing biotin therapy and /or ingesting biotin supplements may contain high levels of biotin.  The higher biotin concentration in these specimens interferes with this Free T4 assay.  Specimens that contain high levels  of biotin may cause false high results for this Free T4 assay.  Please interpret results in light of the total clinical presentation of the patient.    . T3, Free 08/26/2017 3.3  2.3 - 4.2 pg/mL Final   Thyroid tests are normal, however, the TSH is close to the upper limit of normal.  I would like to repeat her thyroid test in 6 months and we  will see her back in a year.  Carlus Pavlov, MD PhD [facility] Endocrinology

## 2017-08-26 NOTE — Patient Instructions (Addendum)
Please stop at the lab.  You can try Hair, Skin, and Nails vitamins.  If you start Biotin, please stop this 1 week before we check thyroid tests.  Please come back for a follow-up appointment in 1 year.

## 2018-08-11 ENCOUNTER — Encounter: Payer: Self-pay | Admitting: Internal Medicine

## 2018-08-11 ENCOUNTER — Other Ambulatory Visit: Payer: Self-pay

## 2018-08-11 ENCOUNTER — Ambulatory Visit (INDEPENDENT_AMBULATORY_CARE_PROVIDER_SITE_OTHER): Payer: Self-pay | Admitting: Internal Medicine

## 2018-08-11 DIAGNOSIS — E0500 Thyrotoxicosis with diffuse goiter without thyrotoxic crisis or storm: Secondary | ICD-10-CM

## 2018-08-11 LAB — T4, FREE: Free T4: 0.83 ng/dL (ref 0.60–1.60)

## 2018-08-11 LAB — T3, FREE: T3, Free: 2.8 pg/mL (ref 2.3–4.2)

## 2018-08-11 LAB — TSH: TSH: 2.69 u[IU]/mL (ref 0.35–4.50)

## 2018-08-11 NOTE — Progress Notes (Signed)
Patient ID: Regina Calhoun, female   DOB: 25-Oct-1970, 48 y.o.   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 Last visit  1 year ago.  Patient is seen with the help of the Spanish interpreter. She does not have insurance (has [employer]).  Reviewed history: She was found to have overactive thyroid at a visit with PCP. They were checked b/c she c/o:  - bloating - irregular menses - skipping 1 mo starting in Aug >> she thought she was pregnant >> urine pregnancy test was negative  Reviewed patient's TFTs: Lab Results  Component Value Date   TSH 4.49 08/26/2017   TSH 3.380 11/08/2016   TSH 3.60 12/12/2015   TSH 2.59 07/18/2015   TSH 2.49 06/11/2015   TSH 4.31 12/10/2014   TSH 2.46 07/19/2014   TSH 2.53 06/07/2014   TSH 0.09 (L) 02/26/2014   TSH 0.008 (L) 01/21/2014   FREET4 1.04 08/26/2017   FREET4 0.78 12/12/2015   FREET4 0.88 07/18/2015   FREET4 0.93 06/11/2015   FREET4 0.75 12/10/2014   FREET4 0.70 07/19/2014   FREET4 0.73 06/07/2014   FREET4 0.84 02/26/2014   FREET4 2.04 (H) 01/21/2014   FREET4 2.01 (H) 11/30/2013    Lab Results  Component Value Date   TSI <89 12/12/2015   TSI 341 (H) 01/17/2014   We started MMI 5 mg 2x a day as the suspicion of her Graves'.  We could not do a thyroid uptake and scan due to price she has no insurance.  Subsequent TFTs have improved so we were able to stop methimazole in 06/2015.  Pt denies: - feeling nodules in neck - hoarseness - dysphagia - choking - SOB with lying down  Pt denies: - weight loss - heat intolerance (but has hot flushes) - tremors - palpitations - anxiety - hyperdefecation Continues to have hair loss.  She was on Biotin >> now off.  ROS: Constitutional: + Weight gain/no weight loss, + occasionall fatigue, + subjective hyperthermia, no subjective hypothermia Eyes: no blurry vision, no xerophthalmia ENT: no sore throat,  + see HPI Cardiovascular: no CP/no SOB/no palpitations/no leg swelling Respiratory: no cough/no SOB/no wheezing Gastrointestinal: no N/no V/no D/no C/no acid reflux Musculoskeletal: no muscle aches/no joint aches Skin: + rash - sunburn neck, + hair loss Neurological: no tremors/no numbness/no tingling/no dizziness, + HA  I reviewed pt's medications, allergies, PMH, social hx, family hx, and changes were documented in the history of present illness. Otherwise, unchanged from my initial visit note.  No past medical history - other than described above.  No past surgical history.   History   Social History  . Marital Status: Married    Spouse Name: N/A    Number of Children: 2   Occupational History  . cleaning   Social History Main Topics  . Smoking status: Never Smoker   . Smokeless tobacco: Not on file  . Alcohol Use: No  . Drug Use: No   No current outpatient medications on file prior to visit.   No current facility-administered medications on file prior to visit.    No Known Allergies Family History  Problem Relation Age of Onset  . Polycystic ovary syndrome Daughter   . Clotting disorder Paternal Grandmother   . Hypothyroidism Mother   . Diabetes Maternal Grandmother    PE: BP 120/70   Pulse (!) 48   Ht 5\' 4"  (1.626 m) Comment: measured  Wt 179 lb (81.2 kg)   LMP  (LMP Unknown)   SpO2 96%   BMI 30.73 kg/m  Body mass index is 30.73 kg/m.  Wt Readings from Last 3 Encounters:  08/11/18 179 lb (81.2 kg)  08/26/17 169 lb 12.8 oz (77 kg)  02/11/17 173 lb (78.5 kg)   Constitutional: overweight, in NAD Eyes: PERRLA, EOMI, no exophthalmos ENT: moist mucous membranes, + mild symmetric thyromegaly, no cervical lymphadenopathy Cardiovascular: RRR, No MRG Respiratory: CTA B Gastrointestinal: abdomen soft, NT, ND, BS+ Musculoskeletal: no deformities, strength intact in all 4 Skin: moist, warm, no rashes Neurological: no tremor with outstretched hands, DTR normal  in all 4  ASSESSMENT: 1. 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  PLAN:  1. Patient with history of thyrotoxicosis but without thyrotoxic symptoms.  No weight loss, no heat intolerance, hyper defecation, palpitations, anxiety, tachycardia.  Initially, she had a very fine bilateral hand tremor, which resolved.  She continues to have hair loss, which is not new.  We could not perform a thyroid uptake and scan due to cost but I suspected Graves' disease based on the elevated TSI antibodies, her symptoms, timeline of the disease, and the absence of nodularity on thyroid exam. -We initially started methimazole, to which she responded very well and we were able to stop this 3 years ago with subsequently normal TFTs including at last visit. -Latest TFTs reviewed from 08/2017 and these were normal.  -At this visit, she does not have any complaints, but she continues to report hair loss and also has some wt gain and hot flushes >> discussed that these can be associated with perimenopause  - now menses every 4-5 months -No need to add beta-blockers since she is not tachycardic or tremulous -We will recheck her TFTs today - I will see her back in a year, but we discussed about contacting me sooner if she does develop hyperthyroid symptoms (explained what they are)  Per patient's preference, communication should be through the Spanish interpreter line.  - time spent with the patient: 15 minutes, of which >50% was spent in obtaining information about her symptoms, reviewing her previous labs, evaluations, and treatments, counseling her about her condition (please see the discussed topics above), and developing a plan to further investigate and treat it; she had a number of questions which I addressed.  Office Visit on 08/11/2018  Component Date Value Ref Range Status  . T3, Free 08/11/2018 2.8  2.3 - 4.2 pg/mL Final  . Free T4 08/11/2018 0.83  0.60 - 1.60 ng/dL Final   Comment: Specimens from patients  who are undergoing biotin therapy and /or ingesting biotin supplements may contain high levels of biotin.  The higher biotin concentration in these specimens interferes with this Free T4 assay.  Specimens that contain high levels  of biotin may cause false high results for this Free T4 assay.  Please interpret results in light of the total clinical presentation of the patient.    Marland Kitchen. TSH 08/11/2018 2.69  0.35 - 4.50 uIU/mL Final   Thyroid tests remain excellent.  Carlus Pavlovristina Lisbet Busker, MD PhD [facility]

## 2018-08-11 NOTE — Patient Instructions (Signed)
Please stop at the lab.  Please come back for a follow-up appointment in 1 year.  

## 2018-08-25 ENCOUNTER — Ambulatory Visit: Payer: Self-pay | Admitting: Internal Medicine

## 2019-03-30 ENCOUNTER — Encounter: Payer: Self-pay | Admitting: Family

## 2019-03-30 ENCOUNTER — Other Ambulatory Visit: Payer: Self-pay

## 2019-03-30 ENCOUNTER — Ambulatory Visit: Payer: Self-pay | Attending: Internal Medicine | Admitting: Family

## 2019-03-30 VITALS — BP 123/78 | HR 61 | Temp 97.7°F | Ht 64.0 in | Wt 183.0 lb

## 2019-03-30 DIAGNOSIS — Z780 Asymptomatic menopausal state: Secondary | ICD-10-CM

## 2019-03-30 DIAGNOSIS — R7303 Prediabetes: Secondary | ICD-10-CM

## 2019-03-30 DIAGNOSIS — Z7689 Persons encountering health services in other specified circumstances: Secondary | ICD-10-CM

## 2019-03-30 LAB — POCT GLYCOSYLATED HEMOGLOBIN (HGB A1C): Hemoglobin A1C: 5.6 % (ref 4.0–5.6)

## 2019-03-30 LAB — GLUCOSE, POCT (MANUAL RESULT ENTRY): POC Glucose: 134 mg/dl — AB (ref 70–99)

## 2019-03-30 NOTE — Progress Notes (Addendum)
New Patient Office Visit  Subjective:  Patient ID: Regina Calhoun, female    DOB: Jan 30, 1971  Age: 49 y.o. MRN: [id number]  CC: Re-establish care  Chief Complaint  Patient presents with  . Establish Care    Pt. is here to re-establish care for prediabetes and cholesterol.     HPI Regina Calhoun is a 49 year old female with history of Graves disease, breast pain in female, elevated LDL cholesterol level, and prediabetes who presents today to re-establish care. Patient accompanied by [facility] Spanish interpreter [place].        PRESENT ILLNESS: Not currently taking any medications. Denies allergies.    PAST HISTORY:  Denies childhood illnesses, surgical history, anxiety, depression, and psychiatric history. Reports had thyroid disease but no longer has it.  Last visit July 2020 with Dr. Cruzita Lederer during that encounter patient was stable and labs were checked and within normal limits with plan to follow-up in 1 year or sooner if hyperthyroid symptoms develop.  Was taking methimazole for thyroid management in 2017 and responded well to medication therefore it was discontinued and has not taken the medication since then. No flu vaccine since 2 years ago and unsure if updated on other immunizations. Has not had a period for a least 1 year with mild hot flashes and night sweats for the past 8 months. Last Pap smear in 2017 and mammogram in 2014.  Not sexually active. 2 adult children. Past Medical History:  Diagnosis Date  . Graves disease    Past Surgical History:  Procedure Laterality Date  . NO PAST SURGERIES      FAMILY HISTORY: Denies family history of hypertension, coronary artery disease, elevated cholesterol levels, stroke, renal disease, cancer, arthritis, tuberculosis, asthma, lung disease, headache, seizure disorder, mental illness, suicide, alcohol or drug addiction, and allergies. Family History  Problem Relation Age of Onset  . Polycystic ovary syndrome  Daughter   . Clotting disorder Paternal Grandmother   . Hypothyroidism Mother   . 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 Lives with daughter. Mina Marble is her religious preference. Denies exercise with a diet consisting of potatoes, eggs, beans, meat, and chicken. Denies alcohol, smoking, and illicit drugs. Social History   Socioeconomic History  . Marital status: Married    Spouse name: Not on file  . Number of children: Not on file  . Years of education: Not on file  . Highest education level: Not on file  Occupational History  . Not on file  Tobacco Use  . Smoking status: Never Smoker  . Smokeless tobacco: Never Used  Substance and Sexual Activity  . Alcohol use: No    Alcohol/week: 0.0 standard drinks  . Drug use: No  . Sexual activity: Not Currently  Other Topics Concern  . Not on file  Social History Narrative  . Not on file   Social Determinants of Health   Financial Resource Strain:   . Difficulty of Paying Living Expenses: Not on file  Food Insecurity:   . Worried About Charity fundraiser in the Last Year: Not on file  . Ran Out of Food in the Last Year: Not on file  Transportation Needs:   . Lack of Transportation (Medical): Not on file  . Lack of Transportation (Non-Medical): Not on file  Physical Activity:   . Days of Exercise per Week: Not on file  . Minutes of Exercise per Session: Not  on file  Stress:   . Feeling of Stress : Not on file  Social Connections:   . Frequency of Communication with Friends and Family: Not on file  . Frequency of Social Gatherings with Friends and Family: Not on file  . Attends Religious Services: Not on file  . Active Member of Clubs or Organizations: Not on file  . Attends Banker Meetings: Not on file  . Marital Status: Not on file  Intimate Partner Violence:   . Fear of Current or Ex-Partner: Not on file  . Emotionally  Abused: Not on file  . Physically Abused: Not on file  . Sexually Abused: Not on file   ROS Review of Systems  Constitutional: Negative for chills and fever.  Respiratory: Negative for cough and shortness of breath.   Cardiovascular: Negative for chest pain and palpitations.  Musculoskeletal:       Right flank pain and hand pain occasionally takes Tylenol which helps.  Neurological: Negative for tremors, sensory change and headaches.  Psychiatric/Behavioral: Negative for depression. The patient is not nervous/anxious.    Objective:   Today's Vitals: BP 123/78 (BP Location: Right Arm, Patient Position: Sitting, Cuff Size: Large)   Pulse 61   Temp 97.7 F (36.5 C) (Temporal)   Ht 5\' 4"  (1.626 m)   Wt 183 lb (83 kg)   LMP  (LMP Unknown)   SpO2 96%   BMI 31.41 kg/m   Physical Exam  General appearance - alert, well appearing, and in no distress and oriented to person, place, and time Mental status - alert, oriented to person, place, and time, normal mood, behavior, speech, dress, motor activity, and thought processes Eyes - pupils equal and reactive, extraocular eye movements intact Ears - bilateral TM's and external ear canals normal Neck - supple, no significant adenopathy Chest - clear to auscultation, no wheezes, rales or rhonchi, symmetric air entry, no tachypnea, retractions or cyanosis Heart - normal rate, regular rhythm, normal S1, S2, no murmurs, rubs, clicks or gallops Neurological - alert, oriented, normal speech, no focal findings or movement disorder noted, neck supple without rigidity, cranial nerves II through XII intact, DTR's normal and symmetric, motor and sensory grossly normal bilaterally, normal muscle tone, no tremors, strength 5/5  Results for orders placed or performed in visit on 03/30/19  Glucose (CBG)  Result Value Ref Range   POC Glucose 134 (A) 70 - 99 mg/dl  HgB 04/01/19  Result Value Ref Range   Hemoglobin A1C 5.6 4.0 - 5.6 %   HbA1c POC (<> result,  manual entry)     HbA1c, POC (prediabetic range)     HbA1c, POC (controlled diabetic range)      Assessment & Plan:  1. Encounter to establish care: -Follow-up with attending physician in about 1 month for physical examination and pap smear -Last mammogram in 2014. Apply for mammogram scholarship.  -Apply for [facility] financial discount  2. Prediabetes:  - Glucose (CBG) - HgB A1c - Lipid Panel - Comprehensive metabolic panel  3. Menopause:  -Lower room temperature, use fans, dress in layers of clothing that can be easily shed, avoid triggers such as spicy foods and stressful situations, and vitamin E purchased over-the-counter.  Problem List Items Addressed This Visit      Other   Prediabetes - Primary   Relevant Orders   Glucose (CBG) (Completed)   HgB A1c      No outpatient encounter medications on file as of 03/30/2019.   No facility-administered  encounter medications on file as of 03/30/2019.    Follow-up: Follow-up with attending physician in about 1 month  Auden Wettstein Jodi Geralds, NP

## 2019-03-30 NOTE — Progress Notes (Signed)
A1C normal.Practice low-carb and low-sugar diet and at least 150 mins/week moderate intensity exercise.  POC glucose non-fasting.

## 2019-03-30 NOTE — Patient Instructions (Addendum)
Follow-up for Pap smear. Apply for breast scholarship for mammogram and [place] financial discount.  La menopausia Menopause La menopausia es el perodo normal de la vida en el que los perodos menstruales cesan por completo. Por lo general, se confirma tras 74meses de no haber tenido un perodo menstrual. La transicin a la menopausia (perimenopausia), con mayor frecuencia, ocurre entre los 55 y los 55aos. Durante la perimenopausia, los niveles hormonales cambian en el [place], lo que puede provocar sntomas y Psychologist, occupational. La menopausia podra aumentar el riesgo de sufrir lo siguiente:  Prdida de masa sea (osteoporosis), lo que predispone a la rotura de huesos (fracturas).  Depresin.  Endurecimiento y Public librarian de las arterias (aterosclerosis), lo que puede causar infartos de miocardio y accidentes cerebrovasculares. Cules son las causas? A menudo, la causa de esta afeccin es un cambio natural en los niveles hormonales, que ocurre a medida que envejece. La afeccin tambin podra ser provocada por una ciruga en la que se extraigan ambos ovarios (ooforectoma bilateral). Qu incrementa el riesgo? Es ms probable que esta afeccin comience a una temprana edad si tiene ciertas afecciones o se realiza ciertos tratamientos, incluidos los siguientes:  Un tumor en la hipfisis del cerebro.  Una enfermedad que afecte los ovarios y la produccin de hormonas.  Radioterapia para tratar Science writer.  Ciertos tratamientos contra el cncer, como quimioterapia o terapia hormonal (antiestrgeno).  Fumar mucho y consumir alcohol de forma excesiva.  Antecedentes familiares de menopausia temprana. Adems, es ms probable que esta afeccin se presente de manera temprana en las mujeres que son [place]. Cules son los signos o los sntomas? Los sntomas de esta afeccin incluyen los siguientes:  Nurse, learning disability.  Perodos menstruales irregulares.  Sudoracin  nocturna.  Cambios en el sentimiento respecto de las [employer]. Es posible que el deseo sexual disminuya o que se sienta ms cmoda respecto de su sexualidad.  Sequedad vaginal y adelgazamiento de las paredes de la vagina. Esto podra hacer que sienta dolor durante las relaciones sexuales.  Sequedad de la piel y aparicin de Glass blower/designer.  Dolores de [place].  Problemas para dormir (insomnio).  Cambios de humor o irritabilidad.  Problemas de memoria.  Aumento de [place].  Crecimiento de bello en la cara y el pecho.  Infecciones en la vejiga o problemas para orinar. Cmo se diagnostica? Esta afeccin se diagnostica en funcin de los antecedentes mdicos, un examen fsico, la edad, los antecedentes menstruales y los sntomas. Tambin le podran realizar estudios hormonales. Cmo se trata? En algunos los casos, no se necesita tratamiento. Usted y el mdico deben decidir juntos si se debe Arts administrator. El tratamiento se determinar en funcin de su cuadro clnico y de sus preferencias. El tratamiento de este cuadro clnico se centra en el control de los sntomas. El tratamiento puede incluir lo siguiente:  Terapia hormonal para la menopausia.  Medicamentos para tratar sntomas o complicaciones especficos.  Acupuntura.  Vitaminas o suplementos herbales. Antes de Biochemist, clinical, es importante que le avise al mdico si tiene antecedentes personales o familiares de lo siguiente:  Enfermedades cardacas.  Cncer de mama.  Cogulos de [place].  Diabetes.  Osteoporosis. Siga estas indicaciones en su casa: Estilo de vida  No consuma ningn producto que contenga nicotina o tabaco, como cigarrillos y Psychologist, sport and exercise. Si necesita ayuda para dejar de fumar, consulte al mdico.  Realice, por lo menos, [id number] de actividad fsica 5das por semana o ms.  [place] con alcohol o cafena, as como  las comidas muy condimentadas. Esto podra  ayudar a prevenir los acaloramientos.  Intente dormir de 7 a 8horas todas las noches.  Si tiene acaloramientos: ? Vstase en capas. ? Evite las cosas que podran [employer] acaloramientos, como las comidas muy condimentadas, los lugares calientes o el estrs. ? Respire profundamente y despacio cuando comience a Scientist, water quality. ? Tenga un ventilador en su casa y en su oficina.  Encuentre modos de [employer], por ejemplo, a travs de la respiracin, meditacin o un diario ntimo.  Considere la posibilidad de asistir a terapia grupal con otras mujeres que tengan sntomas de [place]. [place] recomendaciones al [employer] reuniones de terapia grupal. Comida y bebida  Siga una dieta saludable y equilibrada que incluya cereales integrales, protenas magras, productos lcteos descremados y [place] frutas y verduras.  El mdico podra recomendarle que agregue una mayor cantidad de soja a su dieta. Algunos de los alimentos que contienen soja son el tofu, el tempeh y la [place] de soja.  Consuma muchos alimentos que contengan calcio y vitaminaD para Nutritional therapist salud sea. Algunos productos que contienen mucho calcio son los [employer] Products, el yogur, los frijoles, las [place], las sardinas, el brcoli y la col rizada. Medicamentos  [employer] de venta libre y los recetados solamente como se lo haya indicado el mdico.  Hable con el mdico antes de Corporate investment banker a tomar cualquier suplemento herbal. Tome las vitaminas y los suplementos recetados como se lo haya indicado el mdico. Estos pueden incluir lo siguiente: ? Calcio. Las mujeres de 51aos o ms deben consumir 1200mg  (miligramos) de . ? VitaminaD. Las mujeres necesitan entre 600 y 800unidades internacionales de vitaminaD todos [place]. ? VitaminasB12 yB6. Procure consumir [place] de vitaminaB12 y 1,5mg  de vitaminaB6 todos los . Instrucciones generales  Lleve un  registro de sus perodos menstruales; incluya lo siguiente: ? El momento en que ocurren. ? Qu tan abundantes son y cunto duran. ? Cunto tiempo transcurre entre cada perodo menstrual.  Lleve un registro de los sntomas; anote cundo comienzan, con qu frecuencia ocurren y cunto duran.  Use lubricantes o humectantes vaginales para aliviar la sequedad vaginal y [address] durante las relaciones sexuales.  Concurra a todas las visitas de control como se lo haya indicado el mdico. Esto es importante. Esto incluye la terapia grupal y la psicoterapia. Comunquese con un mdico si:  An tiene perodos menstruales despus de los 55aos.  Siente dolor durante las Personnel officer.  No tuvo perodos menstruales durante los ltimos [employer] y presenta sangrado vaginal. Solicite ayuda de inmediato si:  Tiene los siguientes sntomas: ? Depresin grave. ? Sangrado vaginal excesivo. ? Dolor al . ? Latidos cardacos rpidos o irregulares (palpitaciones). ? Dolor de cabeza intenso. ? Dolor en el abdomen (abdominal) o dispepsia grave.  Se cay y cree que se ha fracturado un hueso.  Siente dolor en el pecho o en la pierna.  Desarrolla problemas de visin.  Se toca un bulto en la mama. Resumen  La menopausia es el perodo normal de la vida en el que los perodos menstruales cesan por completo. Por lo general, se confirma tras [employer] de no haber tenido un perodo menstrual.  La transicin a la menopausia (perimenopausia), con mayor frecuencia, ocurre entre los 45 y los 55aos.  Los sntomas pueden controlarse mediante medicamentos, cambios en el estilo de vida y terapias complementarias, como acupuntura.  Siga una dieta equilibrada que incluya muchos nutrientes para 01-07-1979 salud sea y  la salud cardaca, y para Chief Operating Officer los sntomas durante la menopausia. Esta informacin no tiene Theme park manager el consejo del mdico. Asegrese de hacerle al mdico  cualquier pregunta que tenga. Document Revised: 10/14/2016 Document Reviewed: 10/14/2016 Elsevier Patient Education  [employer]

## 2019-03-31 LAB — COMPREHENSIVE METABOLIC PANEL
ALT: 25 IU/L (ref 0–32)
AST: 23 IU/L (ref 0–40)
Albumin/Globulin Ratio: 1.7 (ref 1.2–2.2)
Albumin: 4.6 g/dL (ref 3.8–4.8)
Alkaline Phosphatase: 101 IU/L (ref 39–117)
BUN/Creatinine Ratio: 15 (ref 9–23)
BUN: 12 mg/dL (ref 6–24)
Bilirubin Total: 0.3 mg/dL (ref 0.0–1.2)
CO2: 20 mmol/L (ref 20–29)
Calcium: 9.5 mg/dL (ref 8.7–10.2)
Chloride: 107 mmol/L — ABNORMAL HIGH (ref 96–106)
Creatinine, Ser: 0.79 mg/dL (ref 0.57–1.00)
GFR calc Af Amer: 102 mL/min/{1.73_m2} (ref >59)
GFR calc non Af Amer: 88 mL/min/{1.73_m2} (ref >59)
Globulin, Total: 2.7 g/dL (ref 1.5–4.5)
Glucose: 114 mg/dL — ABNORMAL HIGH (ref 65–99)
Potassium: 4.3 mmol/L (ref 3.5–5.2)
Sodium: 143 mmol/L (ref 134–144)
Total Protein: 7.3 g/dL (ref 6.0–8.5)

## 2019-03-31 LAB — LIPID PANEL
Chol/HDL Ratio: 3.1 ratio (ref 0.0–4.4)
Cholesterol, Total: 173 mg/dL (ref 100–199)
HDL: 56 mg/dL (ref >39)
LDL Chol Calc (NIH): 102 mg/dL — ABNORMAL HIGH (ref 0–99)
Triglycerides: 79 mg/dL (ref 0–149)
VLDL Cholesterol Cal: 15 mg/dL (ref 5–40)

## 2019-04-02 NOTE — Progress Notes (Signed)
LDL slightly elevated. Low-fat diet and 150 mins/week of moderate exercise recommended.

## 2019-04-06 ENCOUNTER — Other Ambulatory Visit: Payer: Self-pay

## 2019-04-06 DIAGNOSIS — Z1231 Encounter for screening mammogram for malignant neoplasm of breast: Secondary | ICD-10-CM

## 2019-05-03 ENCOUNTER — Ambulatory Visit: Payer: Self-pay

## 2019-05-11 ENCOUNTER — Other Ambulatory Visit: Payer: Self-pay

## 2019-05-11 ENCOUNTER — Encounter: Payer: Self-pay | Admitting: Internal Medicine

## 2019-05-11 ENCOUNTER — Ambulatory Visit: Payer: Self-pay | Attending: Internal Medicine | Admitting: Internal Medicine

## 2019-05-11 VITALS — BP 120/78 | HR 53 | Temp 97.7°F | Resp 16 | Wt 177.4 lb

## 2019-05-11 DIAGNOSIS — Z124 Encounter for screening for malignant neoplasm of cervix: Secondary | ICD-10-CM

## 2019-05-11 DIAGNOSIS — N841 Polyp of cervix uteri: Secondary | ICD-10-CM

## 2019-05-11 NOTE — Progress Notes (Signed)
Patient ID: Regina Calhoun, female    DOB: 1970-04-02  MRN: [id number]  CC: Gynecologic Exam   Subjective: Regina Calhoun is a 49 y.o. female who presents for pap Her concerns today include:  Hyperlipidemia, graves ds,   Pt is G2P2 No abnormal paps in past or cervical bx No vaginal dischg or itching at this time Current not sexually active and not on any method of BC Pt is post menopausal since 03/2018.   Last MMG was 5 yrs ago.  One schedule 06/14/2019  I went over lab results from her visit with the nurse practitioner in February.  She had mild elevation of LDL cholesterol.  Kidney and liver function were good. Patient Active Problem List   Diagnosis Date Noted  . Elevated LDL cholesterol level 02/11/2017  . Prediabetes 02/11/2017  . Graves disease 12/10/2014  . Breast pain in female 05/16/2012     No current outpatient medications on file prior to visit.   No current facility-administered medications on file prior to visit.    No Known Allergies  Social History   Socioeconomic History  . Marital status: Married    Spouse name: Not on file  . Number of children: Not on file  . Years of education: Not on file  . Highest education level: Not on file  Occupational History  . Not on file  Tobacco Use  . Smoking status: Never Smoker  . Smokeless tobacco: Never Used  Substance and Sexual Activity  . Alcohol use: No    Alcohol/week: 0.0 standard drinks  . Drug use: No  . Sexual activity: Not Currently  Other Topics Concern  . Not on file  Social History Narrative  . Not on file   Social Determinants of Health   Financial Resource Strain:   . Difficulty of Paying Living Expenses:   Food Insecurity:   . Worried About Charity fundraiser in the Last Year:   . Arboriculturist in the Last Year:   Transportation Needs:   . Film/video editor (Medical):   Marland Kitchen Lack of Transportation (Non-Medical):   Physical Activity:   . Days of Exercise  per Week:   . Minutes of Exercise per Session:   Stress:   . Feeling of Stress :   Social Connections:   . Frequency of Communication with Friends and Family:   . Frequency of Social Gatherings with Friends and Family:   . Attends Religious Services:   . Active Member of Clubs or Organizations:   . Attends Archivist Meetings:   Marland Kitchen Marital Status:   Intimate Partner Violence:   . Fear of Current or Ex-Partner:   . Emotionally Abused:   Marland Kitchen Physically Abused:   . Sexually Abused:     Family History  Problem Relation Age of Onset  . Polycystic ovary syndrome Daughter   . Clotting disorder Paternal Grandmother   . Hypothyroidism Mother   . Diabetes Maternal Grandmother     Past Surgical History:  Procedure Laterality Date  . NO PAST SURGERIES      ROS: Review of Systems Negative except as stated above  PHYSICAL EXAM: BP 120/78   Pulse (!) 53   Temp 97.7 F (36.5 C)   Resp 16   Wt 177 lb 6.4 oz (80.5 kg)   LMP  (LMP Unknown)   SpO2 97%   BMI 30.45 kg/m   Physical Exam  General appearance - alert, well appearing, and in  no distress Mental status - normal mood, behavior, speech, dress, motor activity, and thought processes Breasts - breasts appear normal, no suspicious masses, no skin or nipple changes or axillary nodes Pelvic - normal external genitalia, vulva, vagina, uterus and adnexa.  She has a fleshy appearing mass in the cervical os.  It is not friable. Extremities - peripheral pulses normal, no pedal edema, no clubbing or cyanosis   CMP Latest Ref Rng & Units 03/30/2019 11/08/2016 05/05/2015  Glucose 65 - 99 mg/dL 433(I) 951(O) 841(Y)  BUN 6 - 24 mg/dL 12 14 9   Creatinine 0.57 - 1.00 mg/dL 6.06 3.01  Sodium 134 - 144 mmol/L 143 141 139  Potassium 3.5 - 5.2 mmol/L 4.3 4.8 4.1  Chloride 96 - 106 mmol/L 107(H) 102 106  CO2 20 - 29 mmol/L 20 25 23   Calcium 8.7 - 10.2 mg/dL 9.5 6.01 9.1  Total Protein 6.0 - 8.5 g/dL 7.3 7.7 -  Total Bilirubin 0.0  - 1.2 mg/dL 0.3 0.3 -  Alkaline Phos 39 - 117 IU/L 101 91 -  AST 0 - 40 IU/L 23 27 -  ALT 0 - 32 IU/L 25 37(H) -   Lipid Panel     Component Value Date/Time   CHOL 173 03/30/2019 1650   TRIG 79 03/30/2019 1650   HDL 56 03/30/2019 1650   CHOLHDL 3.1 03/30/2019 1650   CHOLHDL 3.3 Ratio 11/23/2007 2101   VLDL 12 11/23/2007 2101   LDLCALC 102 (H) 03/30/2019 1650    CBC    Component Value Date/Time   WBC 9.1 11/08/2016 1214   WBC 7.7 05/05/2015 1526   RBC 5.04 11/08/2016 1214   RBC 4.86 05/05/2015 1526   HGB 14.1 11/08/2016 1214   HCT 44.3 11/08/2016 1214   PLT 368 11/08/2016 1214   MCV 88 11/08/2016 1214   MCH 28.0 11/08/2016 1214   MCH 28.8 05/05/2015 1526   MCHC 31.8 11/08/2016 1214   MCHC 33.6 05/05/2015 1526   RDW 14.3 11/08/2016 1214   LYMPHSABS 3,003 05/05/2015 1526   MONOABS 462 05/05/2015 1526   EOSABS 539 (H) 05/05/2015 1526   BASOSABS 77 05/05/2015 1526    ASSESSMENT AND PLAN: 1. Pap smear for cervical cancer screening - Cytology - PAP  2. Cervical polyp Mass seen within the cervical os appears to be a polyp but I am not sure as it does not extend or hang outside of the os.  We will send her to gynecology for evaluation. - Ambulatory referral to Gynecology    Patient was given the opportunity to ask questions.  Patient verbalized understanding of the plan and was able to repeat key elements of the plan.  [employer] interpreter used during this encounter. 07/05/2015  No orders of the defined types were placed in this encounter.    Requested Prescriptions    No prescriptions requested or ordered in this encounter    No follow-ups on file.  07/05/2015, MD, FACP

## 2019-05-16 LAB — CYTOLOGY - PAP
Comment: NEGATIVE
Diagnosis: NEGATIVE
High risk HPV: NEGATIVE

## 2019-06-05 ENCOUNTER — Telehealth: Payer: Self-pay

## 2019-06-05 NOTE — Telephone Encounter (Signed)
Via Alcario Drought, Spanish Interpreter([facility]). Patient called to clarify her appointment times. She had [facility] stating that her appointment was Jun 14, 2019 at 9:30 am. Patient informed [facility] appointment is 06/14/2019 @ 8:30 am and then she will go to [facility] for [facility] on Jun 14, 2019 @ 9:30am.

## 2019-06-14 ENCOUNTER — Ambulatory Visit
Admission: RE | Admit: 2019-06-14 | Discharge: 2019-06-14 | Disposition: A | Discharge status: Home / Self Care | Payer: Self-pay | Source: Ambulatory Visit | Attending: Obstetrics and Gynecology | Admitting: Obstetrics and Gynecology

## 2019-06-14 ENCOUNTER — Ambulatory Visit: Payer: Self-pay | Admitting: *Deleted

## 2019-06-14 ENCOUNTER — Other Ambulatory Visit: Payer: Self-pay

## 2019-06-14 VITALS — BP 122/82 | Temp 97.1°F | Wt 175.6 lb

## 2019-06-14 DIAGNOSIS — Z1239 Encounter for other screening for malignant neoplasm of breast: Secondary | ICD-10-CM

## 2019-06-14 DIAGNOSIS — Z1231 Encounter for screening mammogram for malignant neoplasm of breast: Secondary | ICD-10-CM

## 2019-06-14 NOTE — Progress Notes (Signed)
Ms. Khamil Lamica Kipp Laurence is a 49 y.o. female who presents to [facility] clinic today with no complaints today. Patient stated she has occasional right outer breast pain that feels like a pulsing. Patient states the last time she had the pain was 3 weeks ago and she rated it at a 1 out of 10. Per previous note 05/16/2012 patient complained of the same pain. Diagnostic mammogram and right breast ultrasound 05/30/2012 negative.   Pap Smear: Pap not smear completed today. Last Pap smear was 05/11/2019 at [facility] and was normal with negative HPV. Per patient has no history of an abnormal Pap smear. Last Pap smear and Pap smear from 12/20/2007 results are in Epic.   Physical exam: Breasts Breasts symmetrical. No skin abnormalities bilateral breasts. No nipple retraction bilateral breasts. No nipple discharge bilateral breasts. No lymphadenopathy. No lumps palpated bilateral breasts. No complaints of pain or tenderness on exam.      Pelvic/Bimanual Pap is not indicated today per [facility] guidelines.   Smoking History: Patient has never smoked.   Patient Navigation: Patient education provided. Access to services provided for patient through [place] program. Spanish interpreter Maretta Los from [facility] provided.    Breast and Cervical Cancer Risk Assessment: Patient has no family history of breast cancer, known genetic mutations, or radiation treatment to the chest before age 77. Patient has no history of cervical dysplasia, immunocompromised, or DES exposure in-utero.  Risk Assessment    Risk Scores      06/14/2019   Last edited by: Priscille Heidelberg, RN   5-year risk: 0.5 %   Lifetime risk: 5.3 %          A: [facility] exam without pap smear   P: Referred patient to the [facility] for a screening mammogram. Appointment scheduled Thursday, Jun 14, 2019 at 0930.  Priscille Heidelberg, RN 06/14/2019 9:08 AM

## 2019-06-14 NOTE — Patient Instructions (Signed)
Explained breast self awareness with [name]. Patient did not need a Pap smear today due to last Pap smear and HPV typing was 05/11/2019. Let her know [facility] will cover Pap smears and HPV typing every 5 years unless has a history of abnormal Pap smears. Referred patient to the [facility] for a screening mammogram. Appointment scheduled Thursday, Jun 14, 2019 at 0930. Patient aware of appointment and will be there. Let patient know the [facility] Center will follow up with her within the next couple weeks with results of her mammogram by letter or phone. [name] verbalized understanding.  Vergene Marland, [name], RN 9:14 AM

## 2019-06-29 ENCOUNTER — Encounter: Payer: Self-pay | Admitting: Family Medicine

## 2019-07-16 ENCOUNTER — Encounter: Payer: Self-pay | Admitting: Obstetrics and Gynecology

## 2019-07-27 ENCOUNTER — Encounter: Payer: Self-pay | Admitting: Obstetrics & Gynecology

## 2019-08-03 ENCOUNTER — Ambulatory Visit (INDEPENDENT_AMBULATORY_CARE_PROVIDER_SITE_OTHER): Payer: Self-pay | Admitting: Internal Medicine

## 2019-08-03 DIAGNOSIS — Z5329 Procedure and treatment not carried out because of patient's decision for other reasons: Secondary | ICD-10-CM

## 2019-08-03 NOTE — Progress Notes (Signed)
Patient ID: Regina Calhoun, female   DOB: 12-Jun-1970, 49 y.o.   MRN: [id number]  This visit occurred during the SARS-CoV-2 public health emergency.  Safety protocols were in place, including screening questions prior to the visit, additional usage of staff PPE, and extensive cleaning of exam room while observing appropriate contact time as indicated for disinfecting solutions.   HPI  Regina Calhoun is a 49 y.o.-year-old female, returning for f/u for thyrotoxicosis (likely Graves ds.). Last visit 1 year ago.  Patient is seen with the help of the Spanish interpreter. She does not have insurance (has [employer]).  Reviewed history: She was found to have overactive thyroid at a visit with PCP. They were checked b/c she c/o:  - bloating - irregular menses - skipping 1 mo starting in Aug >> she thought she was pregnant >> urine pregnancy test was negative  Reviewed her TFTs: Lab Results  Component Value Date   TSH 2.69 08/11/2018   TSH 4.49 08/26/2017   TSH 3.380 11/08/2016   TSH 3.60 12/12/2015   TSH 2.59 07/18/2015   TSH 2.49 06/11/2015   TSH 4.31 12/10/2014   TSH 2.46 07/19/2014   TSH 2.53 06/07/2014   TSH 0.09 (L) 02/26/2014   FREET4 0.83 08/11/2018   FREET4 1.04 08/26/2017   FREET4 0.78 12/12/2015   FREET4 0.88 07/18/2015   FREET4 0.93 06/11/2015   FREET4 0.75 12/10/2014   FREET4 0.70 07/19/2014   FREET4 0.73 06/07/2014   FREET4 0.84 02/26/2014   FREET4 2.04 (H) 01/21/2014    Lab Results  Component Value Date   TSI <89 12/12/2015   TSI 341 (H) 01/17/2014   We started MMI 5 mg 2x a day as the suspicion of her Graves'.  Subsequent TFTs improved and normalized and we were able to stop methimazole in 06/2015.  We could not check a thyroid uptake and scan due to price as she had no insurance.    Pt denies: - feeling nodules in neck - hoarseness - dysphagia - choking - SOB with lying down  She was on Biotin >> now off.  ROS: Constitutional: no  weight gain/no weight loss, no fatigue, no subjective hyperthermia, no subjective hypothermia Eyes: no blurry vision, no xerophthalmia ENT: no sore throat, + see HPI Cardiovascular: no CP/no SOB/no palpitations/no leg swelling Respiratory: no cough/no SOB/no wheezing Gastrointestinal: no N/no V/no D/no C/no acid reflux Musculoskeletal: no muscle aches/no joint aches Skin: no rashes, + hair loss Neurological: no tremors/no numbness/no tingling/no dizziness  I reviewed pt's medications, allergies, PMH, social hx, family hx, and changes were documented in the history of present illness. Otherwise, unchanged from my initial visit note.  No past medical history - other than described above.  No past surgical history.   History   Social History  . Marital Status: Married    Spouse Name: N/A    Number of Children: 2   Occupational History  . cleaning   Social History Main Topics  . Smoking status: Never Smoker   . Smokeless tobacco: Not on file  . Alcohol Use: No  . Drug Use: No   No current outpatient medications on file prior to visit.   No current facility-administered medications on file prior to visit.   No Known Allergies Family History  Problem Relation Age of Onset  . Polycystic ovary syndrome Daughter   . Clotting disorder Paternal Grandmother   . Hypothyroidism Mother   . Diabetes Maternal Grandmother    PE: LMP  (  LMP Unknown)  There is no height or weight on file to calculate BMI.  Wt Readings from Last 3 Encounters:  06/14/19 175 lb 9.6 oz (79.7 kg)  05/11/19 177 lb 6.4 oz (80.5 kg)  03/30/19 183 lb (83 kg)   Constitutional: overweight, in NAD Eyes: PERRLA, EOMI, no exophthalmos ENT: moist mucous membranes, + mild, symmetric thyromegaly, no cervical lymphadenopathy Cardiovascular: RRR, No MRG Respiratory: CTA B Gastrointestinal: abdomen soft, NT, ND, BS+ Musculoskeletal: no deformities, strength intact in all 4 Skin: moist, warm, no  rashes Neurological: no tremor with outstretched hands, DTR normal in all 4  ASSESSMENT: 1. 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  PLAN:  1. Patient with history of thyrotoxicosis but without thyrotoxic symptoms.  No history of weight loss, heat intolerance, hyper defecation, palpitations, anxiety, tachycardia.  Initially, she had a very fine bilateral hand tremor, which resolved.  She did have a loss, which is chronic.  We could not perform a thyroid uptake and scan for her due to cost but her TSI antibodies were elevated and there was absence of thyroid nodularity on exam.  Also, based on the timeline of the disease, the most likely diagnosis is Graves' disease. -We initially started methimazole, to which she responded very well and we were able to stop this and 06/2015.  Subsequent labs remained normal, including at last visit in 08/2018. -At this visit, she continues to not have any complaints except for hair loss.  At last visit she described some weight gain and hot flashes we discussed that these could be associated with perimenopause.  At that time her menses occurred every 4 to 5 months. -At today's visit we will recheck her TFTs and will see if he needs to restart methimazole -No need for beta-blockers as she is not tachycardic or tremulous -I again advised her to contact me sooner than next visit in case she develops hypothyroid symptoms -Otherwise, I will see her back in a year  Per patient's preference, communication should be through the Spanish interpreter line.   Carlus Pavlov, MD PhD [facility]

## 2019-08-07 ENCOUNTER — Encounter: Payer: Self-pay | Admitting: Internal Medicine

## 2019-08-10 ENCOUNTER — Ambulatory Visit: Payer: Self-pay | Admitting: Internal Medicine

## 2019-09-14 ENCOUNTER — Ambulatory Visit (INDEPENDENT_AMBULATORY_CARE_PROVIDER_SITE_OTHER): Payer: Self-pay | Admitting: Obstetrics & Gynecology

## 2019-09-14 ENCOUNTER — Encounter: Payer: Self-pay | Admitting: Obstetrics & Gynecology

## 2019-09-14 ENCOUNTER — Other Ambulatory Visit: Payer: Self-pay

## 2019-09-14 DIAGNOSIS — N841 Polyp of cervix uteri: Secondary | ICD-10-CM

## 2019-09-14 NOTE — Progress Notes (Signed)
   GYNECOLOGY OFFICE VISIT NOTE  History:   Regina Calhoun is a 49 y.o. [id number] here today for evaluation of cervical polyp noted during her annual exam by her PCP in 05/11/2019. Patient is Spanish-speaking only, interpreter present for this encounter.  She denies any abnormal vaginal discharge, bleeding, pelvic pain or other concerns.    Past Medical History:  Diagnosis Date  . Graves disease     Past Surgical History:  Procedure Laterality Date  . NO PAST SURGERIES      The following portions of the patient's history were reviewed and updated as appropriate: allergies, current medications, past family history, past medical history, past social history, past surgical history and problem list.   Health Maintenance:  Normal pap and negative HRHPV on 05/11/2019.  Normal mammogram on 06/14/2019.   Review of Systems:  Pertinent items noted in HPI and remainder of comprehensive ROS otherwise negative.  Physical Exam:  BP 128/74   Pulse (!) 51   Wt 175 lb (79.4 kg)   LMP  (LMP Unknown)   BMI 30.04 kg/m  CONSTITUTIONAL: Well-developed, well-nourished female in no acute distress.  SKIN: No rash noted. Not diaphoretic. No erythema. No pallor. MUSCULOSKELETAL: Normal range of motion. No edema noted. NEUROLOGIC: Alert and oriented to person, place, and time. Normal muscle tone coordination. No cranial nerve deficit noted. PSYCHIATRIC: Normal mood and affect. Normal behavior. Normal judgment and thought content. CARDIOVASCULAR: Normal heart rate noted RESPIRATORY: Effort and breath sounds normal, no problems with respiration noted ABDOMEN: No masses noted. No other overt distention noted.   PELVIC: Normal appearing external genitalia; normal urethral meatus; normal appearing vaginal mucosa and cervix.  Two tiny 2 mm wide pink, polypoid lesions noted in cervical os, not protruding and about 2 mm from external os edge. Not bleeding.  No abnormal discharge noted.  Performed in the  presence of a chaperone.    Assessment and Plan:      1. Cervical polyp Very small polypoid lesions, noted, no intervention needed for these at this point. Patient told to let us know if she has AUB or other concerns. Routine preventative health maintenance measures emphasized. Please refer to After Visit Summary for other counseling recommendations.   Return for any gynecologic concerns.    Total face-to-face time with patient: 15 minutes.  Over 50% of encounter was spent on counseling and coordination of care.   Jaynie Collins, MD, FACOG Obstetrician & Gynecologist, [facility] for [employer], [facility]

## 2020-06-16 ENCOUNTER — Other Ambulatory Visit: Payer: Self-pay | Admitting: Obstetrics and Gynecology

## 2020-06-16 DIAGNOSIS — Z1231 Encounter for screening mammogram for malignant neoplasm of breast: Secondary | ICD-10-CM

## 2020-07-17 ENCOUNTER — Ambulatory Visit: Payer: Self-pay | Admitting: *Deleted

## 2020-07-17 ENCOUNTER — Other Ambulatory Visit: Payer: Self-pay

## 2020-07-17 ENCOUNTER — Telehealth: Payer: Self-pay | Admitting: Internal Medicine

## 2020-07-17 ENCOUNTER — Ambulatory Visit: Payer: No Typology Code available for payment source

## 2020-07-17 VITALS — BP 128/88 | Wt 180.3 lb

## 2020-07-17 DIAGNOSIS — N644 Mastodynia: Secondary | ICD-10-CM

## 2020-07-17 DIAGNOSIS — Z1239 Encounter for other screening for malignant neoplasm of breast: Secondary | ICD-10-CM

## 2020-07-17 NOTE — Progress Notes (Signed)
Regina Calhoun is a 50 y.o. female who presents to [facility] clinic today with complaint of left outer breast pain x one month that occurs once per day. Patient rates the pain at a 2 out of 10.    Pap Smear: Pap smear not completed today. Last Pap smear was 05/11/2019 at [facility] and was normal with negative HPV. Per patient has no history of an abnormal Pap smear. Last Pap smear result is available in Epic.   Physical exam: Breasts Breasts symmetrical. No skin abnormalities bilateral breasts. No nipple retraction bilateral breasts. No nipple discharge bilateral breasts. No lymphadenopathy. No lumps palpated bilateral breasts. Complaints of left outer breast pain at 3 o'clock on exam.     MS DIGITAL SCREENING TOMO BILATERAL  Result Date: 06/14/2019 CLINICAL DATA:  Screening. EXAM: DIGITAL SCREENING BILATERAL MAMMOGRAM WITH TOMO AND CAD COMPARISON:  Previous exam(s). ACR Breast Density Category b: There are scattered areas of fibroglandular density. FINDINGS: There are no findings suspicious for malignancy. Images were processed with CAD. IMPRESSION: No mammographic evidence of malignancy. A result letter of this screening mammogram will be mailed directly to the patient. RECOMMENDATION: Screening mammogram in one year. (Code:SM-B-01Y) BI-RADS CATEGORY  1: Negative. Electronically Signed   By: Elberta Fortis M.D.   On: 06/14/2019 13:27    Pelvic/Bimanual Pap is not indicated today per BCCCP guidelines.   Smoking History: Patient has never smoked.   Patient Navigation: Patient education provided. Access to services provided for patient through [employer] program. Spanish interpreter Alene Mires from [facility] provided.   Colorectal Cancer Screening: Per patient has never had colonoscopy completed. No complaints today.    Breast and Cervical Cancer Risk Assessment: Patient does not have family history of breast cancer, known genetic mutations, or radiation  treatment to the chest before age 8. Patient does not have history of cervical dysplasia, immunocompromised, or DES exposure in-utero.  Risk Assessment     Risk Scores       07/17/2020 06/14/2019   Last edited by: Meryl Dare, CMA Belva Koziel, Carlye Grippe, RN   5-year risk: 0.6 % 0.5 %   Lifetime risk: 5.2 % 5.3 %            A: BCCCP exam without pap smear Complaint of right outer breast focal pain.  P: Referred patient to the [facility] for a diagnostic mammogram. Appointment scheduled Thursday, July 24, 2020 at 1010.  Priscille Heidelberg, RN 07/17/2020 9:34 AM

## 2020-07-17 NOTE — Patient Instructions (Signed)
Explained breast self awareness with [name]. Patient did not need a Pap smear today due to last Pap smear and HPV typing was 05/11/2019. Let her know BCCCP will cover Pap smears and HPV typing every 5 years unless has a history of abnormal Pap smears. Referred patient to the [facility] for a diagnostic mammogram. Appointment scheduled Thursday, July 24, 2020 at 1010. Patient aware of appointment and will be there. [name] verbalized understanding.  Regina Calhoun, [name], RN 9:34 AM

## 2020-07-24 ENCOUNTER — Ambulatory Visit
Admission: RE | Admit: 2020-07-24 | Discharge: 2020-07-24 | Disposition: A | Discharge status: Home / Self Care | Payer: No Typology Code available for payment source | Source: Ambulatory Visit | Attending: Obstetrics and Gynecology | Admitting: Obstetrics and Gynecology

## 2020-07-24 ENCOUNTER — Other Ambulatory Visit: Payer: Self-pay

## 2020-07-24 DIAGNOSIS — N644 Mastodynia: Secondary | ICD-10-CM

## 2020-09-01 ENCOUNTER — Encounter: Payer: No Typology Code available for payment source | Admitting: Internal Medicine

## 2020-09-04 ENCOUNTER — Encounter: Payer: No Typology Code available for payment source | Admitting: Internal Medicine

## 2020-10-03 ENCOUNTER — Encounter: Payer: No Typology Code available for payment source | Admitting: Internal Medicine

## 2020-12-19 ENCOUNTER — Ambulatory Visit: Payer: Self-pay | Attending: Internal Medicine | Admitting: Internal Medicine

## 2020-12-19 ENCOUNTER — Other Ambulatory Visit: Payer: Self-pay

## 2020-12-19 VITALS — BP 131/79 | HR 66 | Ht 65.5 in | Wt 182.5 lb

## 2020-12-19 DIAGNOSIS — E0500 Thyrotoxicosis with diffuse goiter without thyrotoxic crisis or storm: Secondary | ICD-10-CM

## 2020-12-19 DIAGNOSIS — Z0001 Encounter for general adult medical examination with abnormal findings: Secondary | ICD-10-CM

## 2020-12-19 DIAGNOSIS — E663 Overweight: Secondary | ICD-10-CM

## 2020-12-19 DIAGNOSIS — R030 Elevated blood-pressure reading, without diagnosis of hypertension: Secondary | ICD-10-CM

## 2020-12-19 DIAGNOSIS — Z0000 Encounter for general adult medical examination without abnormal findings: Secondary | ICD-10-CM

## 2020-12-19 DIAGNOSIS — Z2821 Immunization not carried out because of patient refusal: Secondary | ICD-10-CM

## 2020-12-19 DIAGNOSIS — Z23 Encounter for immunization: Secondary | ICD-10-CM

## 2020-12-19 DIAGNOSIS — Z1211 Encounter for screening for malignant neoplasm of colon: Secondary | ICD-10-CM

## 2020-12-19 DIAGNOSIS — B351 Tinea unguium: Secondary | ICD-10-CM

## 2020-12-19 MED ORDER — TERBINAFINE HCL 250 MG PO TABS
250.0000 mg | ORAL_TABLET | Freq: Every day | ORAL | 0 refills | Status: DC
Start: 2020-12-19 — End: 2023-01-13
  Filled 2020-12-19: qty 45, 45d supply, fill #0

## 2020-12-19 NOTE — Progress Notes (Signed)
Patient ID: Regina Calhoun, female    DOB: 1970-05-28  MRN: [id number]  CC: Annual Exam   Subjective: Regina Calhoun is a 50 y.o. female who presents for annua exam Her concerns today include:  Hyperlipidemia, graves ds,   HM:  declines flu vaccine.  Agrees for Tdapt vaccine and Shingrix.  Reports having completed 3 COVID vaccines.  Due for colon CA screening.   Patient Active Problem List   Diagnosis Date Noted   Cervical polyp 09/14/2019   Elevated LDL cholesterol level 02/11/2017   Prediabetes 02/11/2017   Graves disease 12/10/2014   Breast pain in female 05/16/2012     No current outpatient medications on file prior to visit.   No current facility-administered medications on file prior to visit.    No Known Allergies  Social History   Socioeconomic History   Marital status: Married    Spouse name: Not on file   Number of children: Not on file   Years of education: Not on file   Highest education level: 11th grade  Occupational History   Not on file  Tobacco Use   Smoking status: Never   Smokeless tobacco: Never  Vaping Use   Vaping Use: Never used  Substance and Sexual Activity   Alcohol use: No    Alcohol/week: 0.0 standard drinks   Drug use: No   Sexual activity: Not Currently    Birth control/protection: Post-menopausal  Other Topics Concern   Not on file  Social History Narrative   Not on file   Social Determinants of Health   Financial Resource Strain: Not on file  Food Insecurity: No Food Insecurity   Worried About Running Out of Food in the Last Year: Never true   Ran Out of Food in the Last Year: Never true  Transportation Needs: No Transportation Needs   Lack of Transportation (Medical): No   Lack of Transportation (Non-Medical): No  Physical Activity: Not on file  Stress: Not on file  Social Connections: Not on file  Intimate Partner Violence: Not on file    Family History  Problem Relation Age of Onset    Polycystic ovary syndrome Daughter    Clotting disorder Paternal Grandmother    Hypothyroidism Mother    Diabetes Maternal Grandmother     Past Surgical History:  Procedure Laterality Date   NO PAST SURGERIES      ROS: Review of Systems  Constitutional:        Not exercising.   HENT:  Negative for congestion, hearing loss and sore throat.   Eyes:        Endorses blurred vision.  No recent eye exam  Respiratory:  Negative for cough.   Cardiovascular:  Negative for chest pain and palpitations.  Endocrine:       Last was Dr. Elvera Lennox 08/2019.  She was being observed off med.  Thyroid level normal.  Denies any palpitations, heat intolerance, diarrhea.  Skin:        Complains of discoloration of the nails on the index and thumb of the left hand and thumb of the right hand times several months.  Psychiatric/Behavioral:  Negative for dysphoric mood. The patient is not nervous/anxious.     PHYSICAL EXAM: BP 131/79   Pulse 66   Ht 5' 5.5" (1.664 m)   Wt 182 lb 8 oz (82.8 kg)   LMP  (LMP Unknown)   SpO2 95%   BMI 29.91 kg/m   Physical Exam 130/81 Repeat  BP General appearance - alert, well appearing, middle-age Hispanic female and in no distress Mental status - normal mood, behavior, speech, dress, motor activity, and thought processes Eyes - pupils equal and reactive, extraocular eye movements intact Ears - bilateral TM's and external ear canals normal Nose - normal and patent, no erythema, discharge or polyps Mouth - mucous membranes moist, pharynx normal without lesions Neck - supple, no significant adenopathy.  No enlargement of the thyroid gland.  No thyroid nodules. Lymphatics - no palpable lymphadenopathy, no hepatosplenomegaly Chest - clear to auscultation, no wheezes, rales or rhonchi, symmetric air entry Heart - normal rate, regular rhythm, normal S1, S2, no murmurs, rubs, clicks or gallops Abdomen - soft, nontender, nondistended, no masses or  organomegaly Musculoskeletal - no joint tenderness, deformity or swelling Extremities - peripheral pulses normal, no pedal edema, no clubbing or cyanosis Skin -second chipped and hyperpigmented nail changes of the left index and thumb and right thumb fingers.  Depression screen [facility] 2/9 12/19/2020 09/14/2019 05/11/2019  Decreased Interest 0 0 0  Down, Depressed, Hopeless 0 0 0  PHQ - 2 Score 0 0 0  Altered sleeping 2 1 -  Tired, decreased energy 0 1 -  Change in appetite 0 0 -  Feeling bad or failure about yourself  0 0 -  Trouble concentrating 0 0 -  Moving slowly or fidgety/restless 0 0 -  Suicidal thoughts 0 0 -  PHQ-9 Score 2 2 -    CMP Latest Ref Rng & Units 03/30/2019 11/08/2016 05/05/2015  Glucose 65 - 99 mg/dL 867(Y) 195(K) 932(I)  BUN 6 - 24 mg/dL 12 14 9   Creatinine 0.57 - 1.00 mg/dL 7.12 4.58  Sodium 134 - 144 mmol/L 143 141 139  Potassium 3.5 - 5.2 mmol/L 4.3 4.8 4.1  Chloride 96 - 106 mmol/L 107(H) 102 106  CO2 20 - 29 mmol/L 20 25 23   Calcium 8.7 - 10.2 mg/dL 9.5 0.99 9.1  Total Protein 6.0 - 8.5 g/dL 7.3 7.7 -  Total Bilirubin 0.0 - 1.2 mg/dL 0.3 0.3 -  Alkaline Phos 39 - 117 IU/L 101 91 -  AST 0 - 40 IU/L 23 27 -  ALT 0 - 32 IU/L 25 37(H) -   Lipid Panel     Component Value Date/Time   CHOL 173 03/30/2019 1650   TRIG 79 03/30/2019 1650   HDL 56 03/30/2019 1650   CHOLHDL 3.1 03/30/2019 1650   CHOLHDL 3.3 Ratio 11/23/2007 2101   VLDL 12 11/23/2007 2101   LDLCALC 102 (H) 03/30/2019 1650    CBC    Component Value Date/Time   WBC 9.1 11/08/2016 1214   WBC 7.7 05/05/2015 1526   RBC 5.04 11/08/2016 1214   RBC 4.86 05/05/2015 1526   HGB 14.1 11/08/2016 1214   HCT 44.3 11/08/2016 1214   PLT 368 11/08/2016 1214   MCV 88 11/08/2016 1214   MCH 28.0 11/08/2016 1214   MCH 28.8 05/05/2015 1526   MCHC 31.8 11/08/2016 1214   MCHC 33.6 05/05/2015 1526   RDW 14.3 11/08/2016 1214   LYMPHSABS 3,003 05/05/2015 1526   MONOABS 462 05/05/2015 1526   EOSABS 539 (H)  05/05/2015 1526   BASOSABS 77 05/05/2015 1526    ASSESSMENT AND PLAN:  1. Annual physical exam Discussed and encourage healthy eating habits and regular exercise.  2. Graves disease Stable off medication.  Check thyroid panel today. - TSH+T4F+T3Free  3. Elevated blood-pressure reading without diagnosis of hypertension DASH diet discussed and encouraged.  Follow-up with clinical pharmacist in 1 month for repeat blood pressure check.  She will be given the shingles vaccine at that time.  4. Onychomycosis Discussed management of onychomycosis using Lamisil. Went over possible side effects of the medication including drug-induced hepatitis.  Advised of signs and symptoms to watch out for that would suggest this.  Should she develop any unexplained vomiting or pain in the right upper quadrant of the abdomen, she should stop the medication and come in.  Patient willing to give the medication a try. - terbinafine (LAMISIL) 250 MG tablet; Take 1 tablet (250 mg total) by mouth daily.  Dispense: 45 tablet; Refill: 0  5. Over weight Healthy eating habits and regular exercise discussed.  Printed information given. - CBC - Comprehensive metabolic panel - Lipid panel - Hemoglobin A1c  6. Influenza vaccination declined   7. Need for Tdap vaccination Given today.  8. Screening for colon cancer - Fecal occult blood, imunochemical([facility])   Patient was given the opportunity to ask questions.  Patient verbalized understanding of the plan and was able to repeat key elements of the plan.  [employer] Language interpreter used during this encounter. #[id number]  No orders of the defined types were placed in this encounter.    Requested Prescriptions    No prescriptions requested or ordered in this encounter    No follow-ups on file.  Jonah Blue, MD, FACP

## 2020-12-19 NOTE — Patient Instructions (Signed)
Alimentación saludable °Healthy Eating °Seguir una modalidad de alimentación saludable puede ayudarlo a alcanzar y mantener un peso saludable, reducir el riesgo de tener enfermedades crónicas y vivir una vida larga y productiva. Es importante que siga una modalidad de alimentación saludable con un nivel adecuado de calorías para su cuerpo. Debe cubrir sus necesidades nutricionales principalmente a través de los alimentos, escogiendo una variedad de alimentos ricos en nutrientes. °¿Cuáles son algunos consejos para seguir este plan? °Lea las etiquetas de los alimentos °Lea las etiquetas y elija las que digan lo siguiente: °Reducido en sodio o con bajo contenido de sodio. °Jugos con 100 % jugo de fruta. °Alimentos con bajo contenido de grasas saturadas y alto contenido de grasas poliinsaturadas y monoinsaturadas. °Alimentos con cereales integrales, como trigo integral, trigo partido, arroz integral y arroz salvaje. °Cereales integrales fortificados con ácido fólico. Se recomienda a las mujeres embarazadas o que desean quedar embarazadas. °Lea las etiquetas y evite: °Los alimentos con una gran cantidad de azúcares agregados. Estos incluyen los alimentos que contienen azúcar moreno, endulzante a base de maíz, jarabe de maíz, dextrosa, fructosa, glucosa, jarabe de maíz de alta fructosa, miel, azúcar invertido, lactosa, jarabe de malta, maltosa, melaza, azúcar sin refinar, sacarosa, trehalosa y azúcar turbinado. °No consuma más que las siguientes cantidades de azúcar agregada por día: °6 cucharaditas (25 g) las mujeres. °9 cucharaditas (38 g) los hombres. °Los alimentos que contienen almidones y cereales refinados o procesados. °Los productos de cereales refinados, como harina blanca, harina de maíz desgerminada, pan blanco y arroz blanco. °Al ir de compras °Elija refrigerios ricos en nutrientes, como verduras, frutas enteras y frutos secos. Evite los refrigerios con alto contenido de calorías y azúcar, como las papas  fritas, los refrigerios frutales y los caramelos. °Use aliños y productos para untar a base de aceite con los alimentos en lugar de grasas sólidas como la mantequilla, la margarina en barra o el queso crema. °Limite las salsas, las mezclas y los productos “instantáneos” preelaborados como el arroz saborizado, los fideos instantáneos y las pastas listas para comer. °Pruebe más fuentes de proteína vegetal, como tofu, tempeh, frijoles negros, edamame, lentejas, frutos secos y semillas. °Explore planes de alimentación como la dieta mediterránea o la dieta vegetariana. °Al cocinar °Use aceite para saltear los alimentos en lugar de grasas sólidas como mantequilla, margarina en barra o manteca de cerdo. °En lugar de freír, trate de cocinar en el horno, en la plancha o en la parrilla, o hervir los alimentos. °Retire la parte grasa de las carnes antes de cocinarlas. °Cocine las verduras al vapor en agua o caldo. °Planificación de las comidas ° °En las comidas, imagine dividir su plato en cuartos: °La mitad del plato tiene frutas y verduras. °Un cuarto del plato tiene cereales integrales. °Un cuarto del plato tiene proteína, especialmente carnes magras, aves, huevos, tofu, frijoles o frutos secos. °Incluya lácteos descremados en su dieta diaria. °Estilo de vida °Elija opciones saludables en todos los ámbitos, como en el hogar, el trabajo, la escuela, los restaurantes y las tiendas. °Prepare los alimentos de un modo seguro: °Lávese las manos después de manipular carnes crudas. °Mantenga las superficies de preparación de los alimentos limpias lavándolas regularmente con agua caliente y jabón. °Mantenga las carnes crudas separadas de los alimentos que están listos para comer como las frutas y las verduras. °Cocine los frutos de mar, carnes, aves y huevos hasta alcanzar la temperatura interna recomendada. °Almacene los alimentos a temperaturas seguras. En general: °Mantenga los alimentos fríos a una temperatura de 40 °  F (4,4 °C)  o inferior. °Mantenga los alimentos calientes a una temperatura de 140 °F (60 °C) o superior. °Mantenga el congelador a una temperatura de 0 °F (-17,8 °C) o inferior. °Los alimentos dejan de ser seguros para su consumo cuando han estado a una temperatura de entre 40° y 140 °F (4,4° y 60 °C) por más de 2 horas. °¿Qué alimentos debo consumir? °Frutas °Propóngase comer el equivalente a 2 tazas de frutas frescas, enlatadas (en su jugo natural) o congeladas cada día. Algunos ejemplos de equivalentes a 1 taza de frutas son 1 manzana pequeña, 8 fresas grandes, 1 taza de fruta enlatada, ½ taza de fruta desecada o 1 taza de jugo 100 %. °Verduras °Propóngase comer el equivalente a 2½ o 3 tazas de verduras frescas y congeladas cada día, incluyendo diferentes variedades y colores. Algunos ejemplos de equivalentes a 1 taza de verduras son 2 zanahorias medianas, 2 tazas de verduras de hoja verde crudas, 1 taza de verduras cortadas (crudas o cocidas) o 1 papa mediana al horno. °Granos °Propóngase comer el equivalente a 6 onzas de cereales integrales por día. Algunos ejemplos de equivalentes a 1 onza de cereales son 1 rebanada de pan, 1 taza de cereal listo para comer, 3 tazas de palomitas de maíz o ½ taza de arroz, pasta o cereales cocidos. °Carnes y otras proteínas °Propóngase comer el equivalente a 5 o 6 onzas de proteína por día. Algunos ejemplos de equivalentes a 1 onza de proteína son 1 huevo, ½ taza de frutos secos o semillas o 1 cucharada (16 g) de mantequilla de maní. Un corte de carne o pescado del tamaño de un mazo de cartas equivale aproximadamente a 3 a 4 onzas. °De las proteínas que consume cada semana, intente que al menos 8 onzas provengan de frutos de mar. Esto incluye el salmón, la trucha, el arenque y las anchoas. °Lácteos °Propóngase comer el equivalente a 3 tazas de lácteos descremados o con bajo contenido de grasa cada día. Algunos ejemplos de equivalentes a 1 taza de lácteos son 1 taza (240 ml) de leche, 8  onzas (250 g) de yogur, 1½ onzas (44 g) de queso natural o 1 taza (240 ml) de leche de soja fortificada. °Grasas y aceites °Propóngase consumir alrededor de 5 cucharaditas (21 g) por día. Elija grasas monoinsaturadas, como el aceite de canola y de oliva, aguacate, mantequilla de maní y la mayoría de los frutos secos, o bien grasas poliinsaturadas, como el aceite de girasol, maíz y soja, nueces, piñones, semillas de sésamo, semillas de girasol y semillas de lino. °Bebidas °Propóngase beber seis vasos de 8 onzas de agua por día. Limite el café a entre tres y cinco tazas de 8 onzas por día. °Limite el consumo de bebidas con cafeína que tengan calorías agregadas, como los refrescos y las bebidas energizantes. °Limite el consumo de alcohol a no más de 1 medida por día si es mujer y no está embarazada, y a 2 medidas por día si es hombre. Una medida equivale a 12 onzas de cerveza (355 ml), 5 onzas de vino (148 ml) o 1½ onzas de bebidas alcohólicas de alta graduación (44 ml). °Condimentos y otros alimentos °Evite agregar cantidades excesivas de sal a los alimentos. Pruebe darles sabor con hierbas y especias en lugar de sal. °Evite agregar azúcar a los alimentos. °Pruebe usar aliños, salsas y productos untables a base de aceite en lugar de grasas sólidas. °Esta información se basa en las pautas generales de nutrición de los [place] Para obtener   más información, visite choosemyplate.gov. Las cantidades exactas pueden variar en función de sus necesidades nutricionales. °Resumen °Un plan de alimentación saludable puede ayudarlo a mantener un peso saludable, reducir el riesgo de tener enfermedades crónicas y mantenerse activo durante toda su vida. °Planifique sus comidas. Asegúrese de consumir las porciones correctas de una variedad de alimentos ricos en nutrientes. °En lugar de freír, trate de cocinar en el horno, en la plancha o en la parrilla, o hervir los alimentos. °Elija opciones saludables en todos los ámbitos, como en el  hogar, el trabajo, la escuela, los restaurantes y las tiendas. °Esta información no tiene como fin reemplazar el consejo del médico. Asegúrese de hacerle al médico cualquier pregunta que tenga. °Document Revised: 09/19/2020 Document Reviewed: 09/19/2020 °Elsevier Patient Education © 2022 Elsevier Inc. ° °

## 2020-12-20 LAB — COMPREHENSIVE METABOLIC PANEL
ALT: 22 IU/L (ref 0–32)
AST: 23 IU/L (ref 0–40)
Albumin/Globulin Ratio: 1.6 (ref 1.2–2.2)
Albumin: 4.4 g/dL (ref 3.8–4.8)
Alkaline Phosphatase: 126 IU/L — ABNORMAL HIGH (ref 44–121)
BUN/Creatinine Ratio: 19 (ref 9–23)
BUN: 15 mg/dL (ref 6–24)
Bilirubin Total: 0.3 mg/dL (ref 0.0–1.2)
CO2: 22 mmol/L (ref 20–29)
Calcium: 9.5 mg/dL (ref 8.7–10.2)
Chloride: 105 mmol/L (ref 96–106)
Creatinine, Ser: 0.78 mg/dL (ref 0.57–1.00)
Globulin, Total: 2.8 g/dL (ref 1.5–4.5)
Glucose: 117 mg/dL — ABNORMAL HIGH (ref 70–99)
Potassium: 5.1 mmol/L (ref 3.5–5.2)
Sodium: 142 mmol/L (ref 134–144)
Total Protein: 7.2 g/dL (ref 6.0–8.5)
eGFR: 92 mL/min/{1.73_m2} (ref >59)

## 2020-12-20 LAB — LIPID PANEL
Chol/HDL Ratio: 3.8 ratio (ref 0.0–4.4)
Cholesterol, Total: 202 mg/dL — ABNORMAL HIGH (ref 100–199)
HDL: 53 mg/dL (ref >39)
LDL Chol Calc (NIH): 113 mg/dL — ABNORMAL HIGH (ref 0–99)
Triglycerides: 205 mg/dL — ABNORMAL HIGH (ref 0–149)
VLDL Cholesterol Cal: 36 mg/dL (ref 5–40)

## 2020-12-20 LAB — TSH+T4F+T3FREE
Free T4: 1.1 ng/dL (ref 0.82–1.77)
T3, Free: 3 pg/mL (ref 2.0–4.4)
TSH: 3.29 u[IU]/mL (ref 0.450–4.500)

## 2020-12-20 LAB — CBC
Hematocrit: 44.2 % (ref 34.0–46.6)
Hemoglobin: 15.1 g/dL (ref 11.1–15.9)
MCH: 29.3 pg (ref 26.6–33.0)
MCHC: 34.2 g/dL (ref 31.5–35.7)
MCV: 86 fL (ref 79–97)
Platelets: 315 10*3/uL (ref 150–450)
RBC: 5.15 x10E6/uL (ref 3.77–5.28)
RDW: 12.6 % (ref 11.7–15.4)
WBC: 8.6 10*3/uL (ref 3.4–10.8)

## 2020-12-20 LAB — HEMOGLOBIN A1C
Est. average glucose Bld gHb Est-mCnc: 120 mg/dL
Hgb A1c MFr Bld: 5.8 % — ABNORMAL HIGH (ref 4.8–5.6)

## 2020-12-20 NOTE — Progress Notes (Signed)
Let patient know that her blood cell counts are normal. Kidney function normal. She has mild elevation in one of his liver function tests which we will observe for now. Cholesterol level elevated at 113 with goal being less than 100.  She is in the range for prediabetes.  Healthy eating habits and regular exercise will help to lower cholesterol and prevent progression to full diabetes.. Thyroid level normal.

## 2021-01-01 ENCOUNTER — Ambulatory Visit: Payer: Self-pay | Admitting: *Deleted

## 2021-01-01 NOTE — Telephone Encounter (Signed)
Pt states she was prescribed terbinafine (LAMISIL) 250 MG tablet and when she takes that medicine it makes her dizzy and she feels disgusted.  It was for fungus on her finger.  Not dizzy now, just after taking the medication.  You will need an interpreter.    Left VM to call back to discuss symptoms. Assisted by interpreter Laurelyn Sickle # [phone]

## 2021-01-02 ENCOUNTER — Ambulatory Visit: Payer: Self-pay | Attending: Internal Medicine

## 2021-01-02 ENCOUNTER — Other Ambulatory Visit: Payer: Self-pay

## 2021-01-02 DIAGNOSIS — Z23 Encounter for immunization: Secondary | ICD-10-CM

## 2021-01-02 NOTE — Telephone Encounter (Signed)
Using [facility] TM#[id number]. Patient called, left VM to return the call to the office to discuss symptoms with a nurse. Unable to reach patient after 3 attempts by [facility] NT, routing to the provider for resolution per protocol.   Message from Crist Infante sent at 01/01/2021 12:50 PM EST  Pt states she was prescribed terbinafine (LAMISIL) 250 MG tablet and when she takes that medicine it makes her dizzy and she feels disgusted.  It was for fungus on her finger.  Not dizzy now, just after taking the medication.  You will need an interpreter.

## 2021-01-02 NOTE — Telephone Encounter (Signed)
Pt states she was prescribed terbinafine (LAMISIL) 250 MG tablet and when she takes that medicine it makes her dizzy and she feels disgusted.  It was for fungus on her finger.  Not dizzy now, just after taking the medication.  You will need an interpreter  2nd attempt to contact patient via interpreter [phone] to  review medication questions and side effects. No answer, left message on voicemail to call clinic back at #[phone].

## 2021-01-09 ENCOUNTER — Ambulatory Visit: Payer: Self-pay | Attending: Internal Medicine | Admitting: Internal Medicine

## 2021-01-09 ENCOUNTER — Other Ambulatory Visit: Payer: Self-pay

## 2021-01-09 VITALS — BP 120/75 | HR 56 | Ht 63.0 in | Wt 178.8 lb

## 2021-01-09 DIAGNOSIS — B351 Tinea unguium: Secondary | ICD-10-CM

## 2021-01-09 MED ORDER — CICLOPIROX 8 % EX SOLN
Freq: Every day | CUTANEOUS | 1 refills | Status: DC
Start: 2021-01-09 — End: 2023-01-13
  Filled 2021-01-12: qty 6.6, 30d supply, fill #0

## 2021-01-09 NOTE — Progress Notes (Signed)
Patient ID: Regina Calhoun, female    DOB: 1970-11-14  MRN: [id number]  CC: Medication Reaction   Subjective: Regina Calhoun is a 50 y.o. female who presents for chronic ds management Her concerns today include:  Hyperlipidemia, graves ds,    Pt c/o rxn to Lamisil - nausea and dizziness that start by 3rd day.  She stopped the med.  We had prescribed it for her on last visit for onychomycosis of the fingernails on her thumb and index finger. Wants other option Patient Active Problem List   Diagnosis Date Noted   Cervical polyp 09/14/2019   Elevated LDL cholesterol level 02/11/2017   Prediabetes 02/11/2017   Graves disease 12/10/2014   Breast pain in female 05/16/2012     Current Outpatient Medications on File Prior to Visit  Medication Sig Dispense Refill   terbinafine (LAMISIL) 250 MG tablet Take 1 tablet (250 mg total) by mouth daily. (Patient not taking: Reported on 01/09/2021) 45 tablet 0   No current facility-administered medications on file prior to visit.    No Known Allergies  Social History   Socioeconomic History   Marital status: Married    Spouse name: Not on file   Number of children: Not on file   Years of education: Not on file   Highest education level: 11th grade  Occupational History   Not on file  Tobacco Use   Smoking status: Never   Smokeless tobacco: Never  Vaping Use   Vaping Use: Never used  Substance and Sexual Activity   Alcohol use: No    Alcohol/week: 0.0 standard drinks   Drug use: No   Sexual activity: Not Currently    Birth control/protection: Post-menopausal  Other Topics Concern   Not on file  Social History Narrative   Not on file   Social Determinants of Health   Financial Resource Strain: Not on file  Food Insecurity: No Food Insecurity   Worried About Running Out of Food in the Last Year: Never true   Ran Out of Food in the Last Year: Never true  Transportation Needs: No Transportation Needs    Lack of Transportation (Medical): No   Lack of Transportation (Non-Medical): No  Physical Activity: Not on file  Stress: Not on file  Social Connections: Not on file  Intimate Partner Violence: Not on file    Family History  Problem Relation Age of Onset   Polycystic ovary syndrome Daughter    Clotting disorder Paternal Grandmother    Hypothyroidism Mother    Diabetes Maternal Grandmother     Past Surgical History:  Procedure Laterality Date   NO PAST SURGERIES      ROS: Review of Systems Negative except as stated above  PHYSICAL EXAM: BP 120/75   Pulse (!) 56   Ht 5\' 3"  (1.6 m)   Wt 178 lb 12.8 oz (81.1 kg)   LMP  (LMP Unknown)   SpO2 96%   BMI 31.67 kg/m   Physical Exam  General appearance - alert, well appearing, and in no distress Mental status - normal mood, behavior, speech, dress, motor activity, and thought processes Nails: Noted to have discoloration and thickness of the nails of both thumb and right index finger.  CMP Latest Ref Rng & Units 12/19/2020 03/30/2019 11/08/2016  Glucose 70 - 99 mg/dL 01/08/2017) 924(Q) 683(M)  BUN 6 - 24 mg/dL 15 12 14   Creatinine 0.57 - 1.00 mg/dL 196(Q 2.29  Sodium 134 - 144 mmol/L 142 143  141  Potassium 3.5 - 5.2 mmol/L 5.1 4.3 4.8  Chloride 96 - 106 mmol/L 105 107(H) 102  CO2 20 - 29 mmol/L 22 20 25   Calcium 8.7 - 10.2 mg/dL 9.5 9.5  Total Protein 6.0 - 8.5 g/dL 7.2 7.3 7.7  Total Bilirubin 0.0 - 1.2 mg/dL 0.3 0.3 0.3  Alkaline Phos 44 - 121 IU/L 126(H) 101 91  AST 0 - 40 IU/L 23 23 27   ALT 0 - 32 IU/L 22 25 37(H)   Lipid Panel     Component Value Date/Time   CHOL 202 (H) 12/19/2020 1120   TRIG 205 (H) 12/19/2020 1120   HDL 53 12/19/2020 1120   CHOLHDL 3.8 12/19/2020 1120   CHOLHDL 3.3 Ratio 11/23/2007 2101   VLDL 12 11/23/2007 2101   LDLCALC 113 (H) 12/19/2020 1120    CBC    Component Value Date/Time   WBC 8.6 12/19/2020 1120   WBC 7.7 05/05/2015 1526   RBC 5.15 12/19/2020 1120   RBC 4.86 05/05/2015  1526   HGB 15.1 12/19/2020 1120   HCT 44.2 12/19/2020 1120   PLT 315 12/19/2020 1120   MCV 86 12/19/2020 1120   MCH 29.3 12/19/2020 1120   MCH 28.8 05/05/2015 1526   MCHC 34.2 12/19/2020 1120   MCHC 33.6 05/05/2015 1526   RDW 12.6 12/19/2020 1120   LYMPHSABS 3,003 05/05/2015 1526   MONOABS 462 05/05/2015 1526   EOSABS 539 (H) 05/05/2015 1526   BASOSABS 77 05/05/2015 1526    ASSESSMENT AND PLAN: 1. Onychomycosis Patient did not tolerate oral Lamisil.  We discussed trying her with topical Penlac.  She is agreeable to trying the medicine.  Informed her that it may take much longer time to work than oral medication.  I went over with her how to use the medication.  Prescription given   Patient was given the opportunity to ask questions.  Patient verbalized understanding of the plan and was able to repeat key elements of the plan.  [employer] Language interpreter used during this encounter. # Ed 07/05/2015  No orders of the defined types were placed in this encounter.    Requested Prescriptions    No prescriptions requested or ordered in this encounter    No follow-ups on file.  07/05/2015, MD, FACP

## 2021-01-11 LAB — FECAL OCCULT BLOOD, IMMUNOCHEMICAL: Fecal Occult Bld: NEGATIVE

## 2021-01-12 ENCOUNTER — Other Ambulatory Visit: Payer: Self-pay

## 2021-01-13 ENCOUNTER — Other Ambulatory Visit: Payer: Self-pay

## 2021-09-04 ENCOUNTER — Ambulatory Visit: Payer: Self-pay | Attending: Internal Medicine

## 2021-10-06 ENCOUNTER — Other Ambulatory Visit: Payer: Self-pay | Admitting: Obstetrics and Gynecology

## 2021-10-06 DIAGNOSIS — Z1231 Encounter for screening mammogram for malignant neoplasm of breast: Secondary | ICD-10-CM

## 2021-12-10 ENCOUNTER — Ambulatory Visit: Payer: Self-pay | Admitting: Hematology and Oncology

## 2021-12-10 ENCOUNTER — Ambulatory Visit
Admission: RE | Admit: 2021-12-10 | Discharge: 2021-12-10 | Disposition: A | Discharge status: Home / Self Care | Payer: No Typology Code available for payment source | Source: Ambulatory Visit | Attending: Internal Medicine | Admitting: Internal Medicine

## 2021-12-10 VITALS — Wt 183.9 lb

## 2021-12-10 DIAGNOSIS — Z1211 Encounter for screening for malignant neoplasm of colon: Secondary | ICD-10-CM

## 2021-12-10 DIAGNOSIS — Z1239 Encounter for other screening for malignant neoplasm of breast: Secondary | ICD-10-CM

## 2021-12-10 DIAGNOSIS — Z1231 Encounter for screening mammogram for malignant neoplasm of breast: Secondary | ICD-10-CM

## 2021-12-10 NOTE — Patient Instructions (Signed)
Taught Regina Calhoun about breast self awareness. Patient did not need a Pap smear today due to last Pap smear was in 2021 per patient. Let her know [facility] will cover Pap smears every 5 years unless has a history of abnormal Pap smears. Referred patient to the [facility] for screening mammogram. Appointment scheduled for 12/10/2021. Patient aware of appointment and will be there. Let patient know will follow up with her within the next couple weeks with results. Regina Calhoun verbalized understanding. She will return to clinic in one year for mammogram. Pap smear due in 2026.  Pascal Lux, NP 10:46 AM

## 2021-12-10 NOTE — Progress Notes (Addendum)
Regina Calhoun is a 51 y.o. female who presents to [facility] clinic today with no complaints.    Pap Smear: Pap not smear completed today. Last Pap smear was 05/11/2019 at [facility] clinic and was normal. Per patient has no history of an abnormal Pap smear. Last Pap smear result is available in Epic.   Physical exam: Breasts Breasts symmetrical. No skin abnormalities bilateral breasts. No nipple retraction bilateral breasts. No nipple discharge bilateral breasts. No lymphadenopathy. No lumps palpated bilateral breasts.     MS DIGITAL DIAG TOMO BILAT  Result Date: 07/24/2020 CLINICAL DATA:  51 year old female presenting for evaluation of intermittent focal burning pain in the left breast for several months. EXAM: DIGITAL DIAGNOSTIC BILATERAL MAMMOGRAM WITH TOMOSYNTHESIS AND CAD; ULTRASOUND LEFT BREAST LIMITED TECHNIQUE: Bilateral digital diagnostic mammography and breast tomosynthesis was performed. The images were evaluated with computer-aided detection.; Targeted ultrasound examination of the left breast was performed COMPARISON:  Previous exam(s). ACR Breast Density Category b: There are scattered areas of fibroglandular density. FINDINGS: A BB has been placed along the lateral anterior aspect of the left breast indicating the focally tender site of concern. There are no suspicious mammographic findings deep to the marker. No suspicious calcifications, masses or areas of distortion are seen in the bilateral breasts. Mammographic images were processed with CAD. Ultrasound targeted to the left breast at 2 o'clock, 3 cm from the nipple, demonstrates normal fibroglandular tissue. No masses or suspicious areas of shadowing are identified. IMPRESSION: 1. There are no suspicious mammographic or targeted sonographic abnormalities at the tender site of concern in the left breast. 2.  No mammographic evidence of malignancy in the bilateral breasts. RECOMMENDATION: 1. Clinical follow-up recommended for the  tender area of concern in the lateral left breast. Any further workup should be based on clinical grounds. 2.  Screening mammogram in one year.(Code:SM-B-01Y) I have discussed the findings and recommendations with the patient. If applicable, a reminder letter will be sent to the patient regarding the next appointment. BI-RADS CATEGORY  1: Negative. Electronically Signed   By: Frederico Hamman M.D.   On: 07/24/2020 11:09   MS DIGITAL SCREENING TOMO BILATERAL  Result Date: 06/14/2019 CLINICAL DATA:  Screening. EXAM: DIGITAL SCREENING BILATERAL MAMMOGRAM WITH TOMO AND CAD COMPARISON:  Previous exam(s). ACR Breast Density Category b: There are scattered areas of fibroglandular density. FINDINGS: There are no findings suspicious for malignancy. Images were processed with CAD. IMPRESSION: No mammographic evidence of malignancy. A result letter of this screening mammogram will be mailed directly to the patient. RECOMMENDATION: Screening mammogram in one year. (Code:SM-B-01Y) BI-RADS CATEGORY  1: Negative. Electronically Signed   By: Elberta Fortis M.D.   On: 06/14/2019 13:27      Pelvic/Bimanual Pap is not indicated today    Smoking History: Patient has never smoked and was not referred to quit line.    Patient Navigation: Patient education provided. Access to services provided for patient through [facility] program. Natale Lay interpreter provided. No transportation provided   Colorectal Cancer Screening: Per patient has never had colonoscopy completed No complaints today. FIT test negative 01/09/21. Repeat FIT test given today.    Breast and Cervical Cancer Risk Assessment: Patient does not have family history of breast cancer, known genetic mutations, or radiation treatment to the chest before age 7. Patient does not have history of cervical dysplasia, immunocompromised, or DES exposure in-utero.  Risk Assessment   No risk assessment data for the current encounter  Risk Scores  07/17/2020    Last edited by: Meryl Dare, CMA   5-year risk: 0.6 %   Lifetime risk: 5.2 %            A: [facility] exam without pap smear No complaints with benign exam.   P: Referred patient to the [facility] for a screening mammogram. Appointment scheduled 12/08/2021.  Ilda Basset A, NP 12/10/2021 10:25 AM

## 2021-12-15 ENCOUNTER — Other Ambulatory Visit: Payer: Self-pay | Admitting: Obstetrics and Gynecology

## 2021-12-15 DIAGNOSIS — R928 Other abnormal and inconclusive findings on diagnostic imaging of breast: Secondary | ICD-10-CM

## 2022-01-06 ENCOUNTER — Ambulatory Visit
Admission: RE | Admit: 2022-01-06 | Discharge: 2022-01-06 | Disposition: A | Discharge status: Home / Self Care | Payer: No Typology Code available for payment source | Source: Ambulatory Visit | Attending: Obstetrics and Gynecology | Admitting: Obstetrics and Gynecology

## 2022-01-06 ENCOUNTER — Other Ambulatory Visit: Payer: Self-pay | Admitting: Obstetrics and Gynecology

## 2022-01-06 DIAGNOSIS — N6310 Unspecified lump in the right breast, unspecified quadrant: Secondary | ICD-10-CM

## 2022-01-06 DIAGNOSIS — R928 Other abnormal and inconclusive findings on diagnostic imaging of breast: Secondary | ICD-10-CM

## 2022-07-05 ENCOUNTER — Other Ambulatory Visit: Payer: No Typology Code available for payment source

## 2022-07-09 ENCOUNTER — Ambulatory Visit
Admission: RE | Admit: 2022-07-09 | Discharge: 2022-07-09 | Disposition: A | Discharge status: Home / Self Care | Payer: No Typology Code available for payment source | Source: Ambulatory Visit | Attending: Obstetrics and Gynecology | Admitting: Obstetrics and Gynecology

## 2022-07-09 DIAGNOSIS — N6310 Unspecified lump in the right breast, unspecified quadrant: Secondary | ICD-10-CM

## 2022-09-23 IMAGING — MG DIGITAL DIAGNOSTIC BILAT W/ TOMO W/ CAD
6 of 10 series · 6 of 30 positions shown · non-contrast
Comparison: Previous exam(s).

CLINICAL DATA: 50-year-old female presenting for evaluation of
intermittent focal burning pain in the left breast for several
months.

EXAM:
DIGITAL DIAGNOSTIC BILATERAL MAMMOGRAM WITH TOMOSYNTHESIS AND CAD;
ULTRASOUND LEFT BREAST LIMITED
TECHNIQUE: Bilateral digital diagnostic mammography and breast tomosynthesis
was performed. The images were evaluated with computer-aided
detection.; Targeted ultrasound examination of the left breast was
performed

[L CC synth-2D]
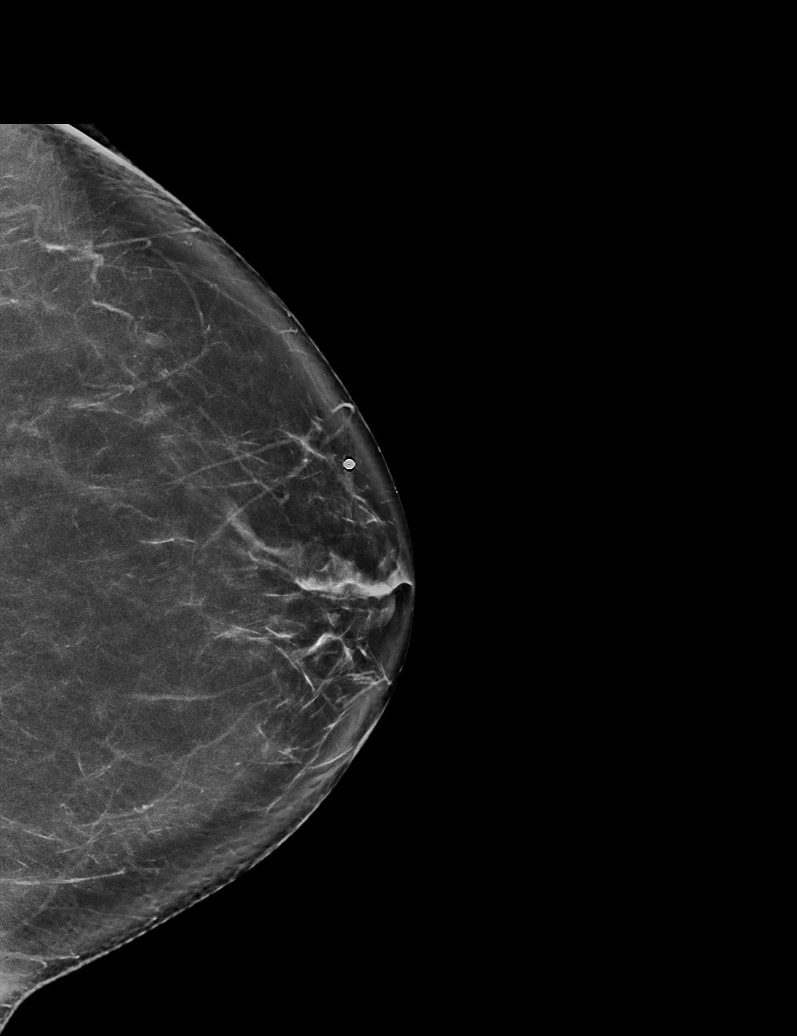

[L MLO synth-2D]
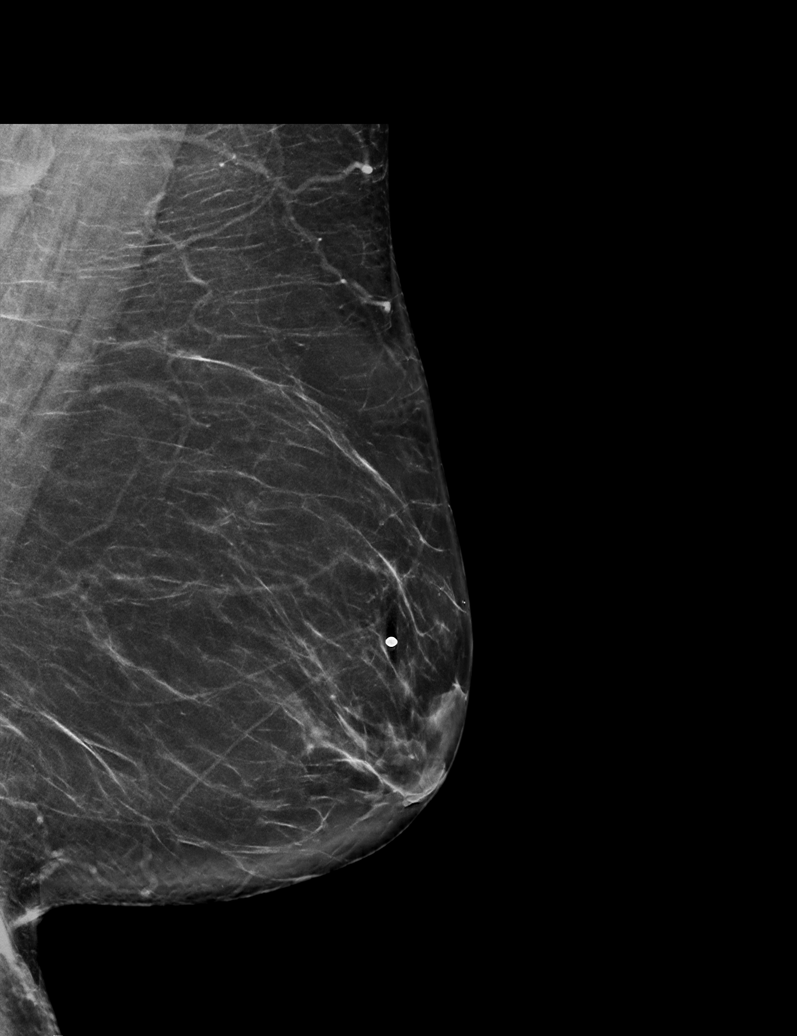

[R CC synth-2D]
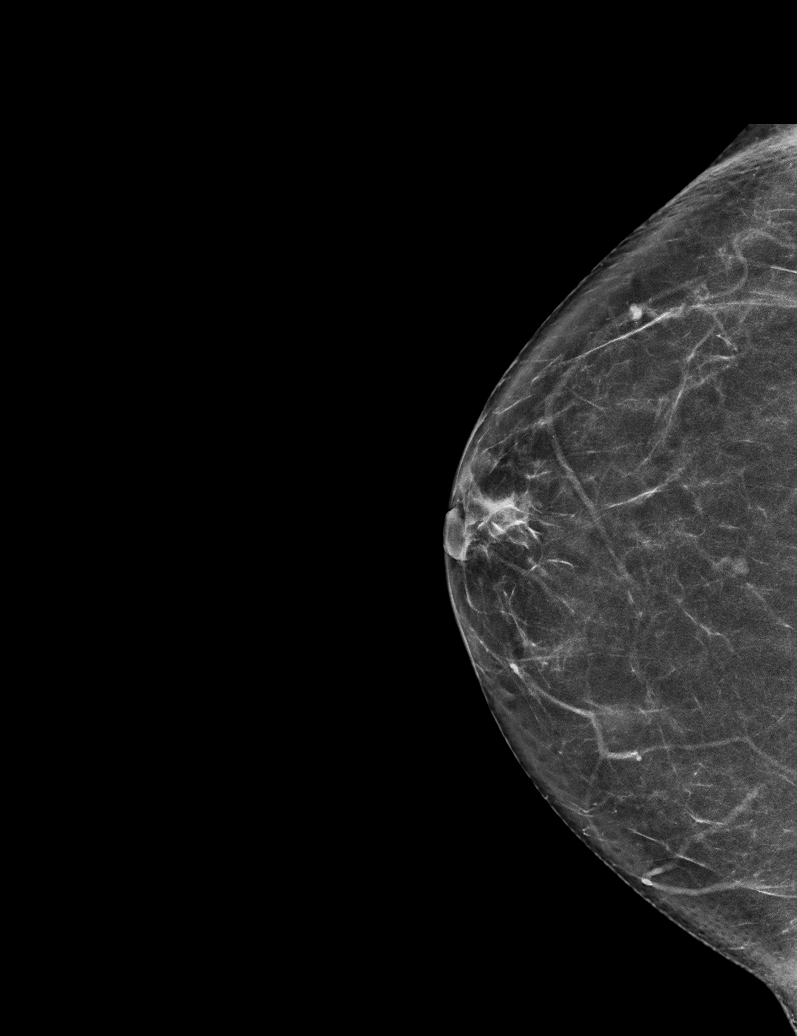

[L TAN synth-2D]
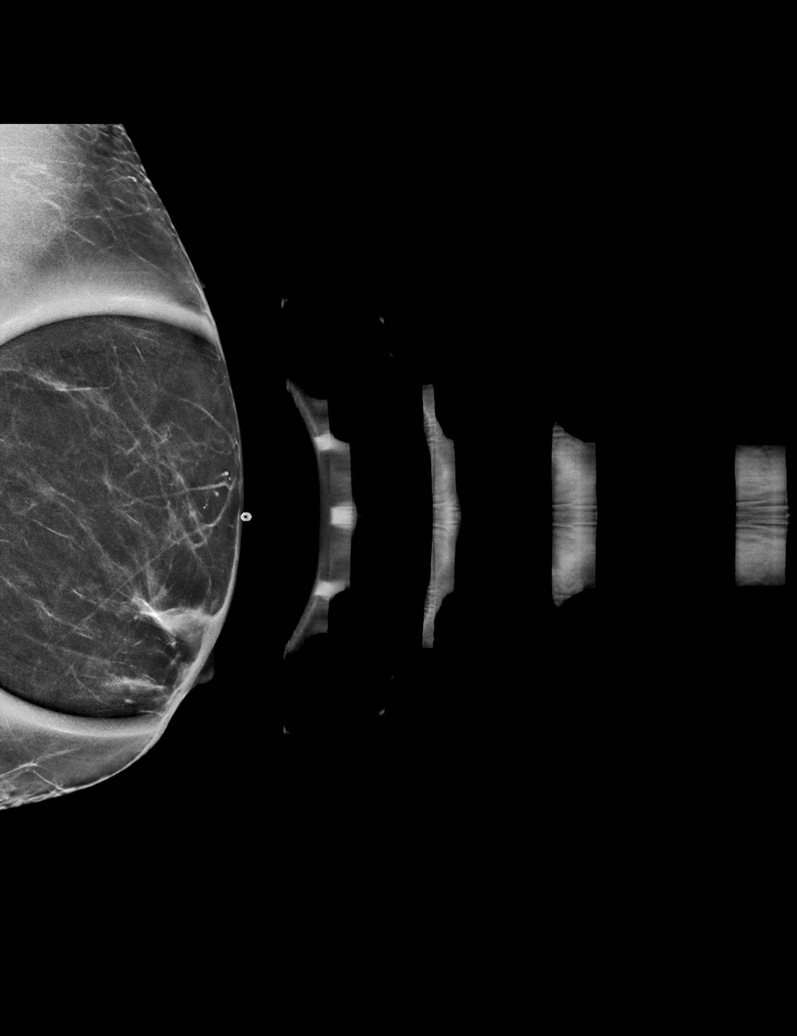

[R MLO synth-2D]
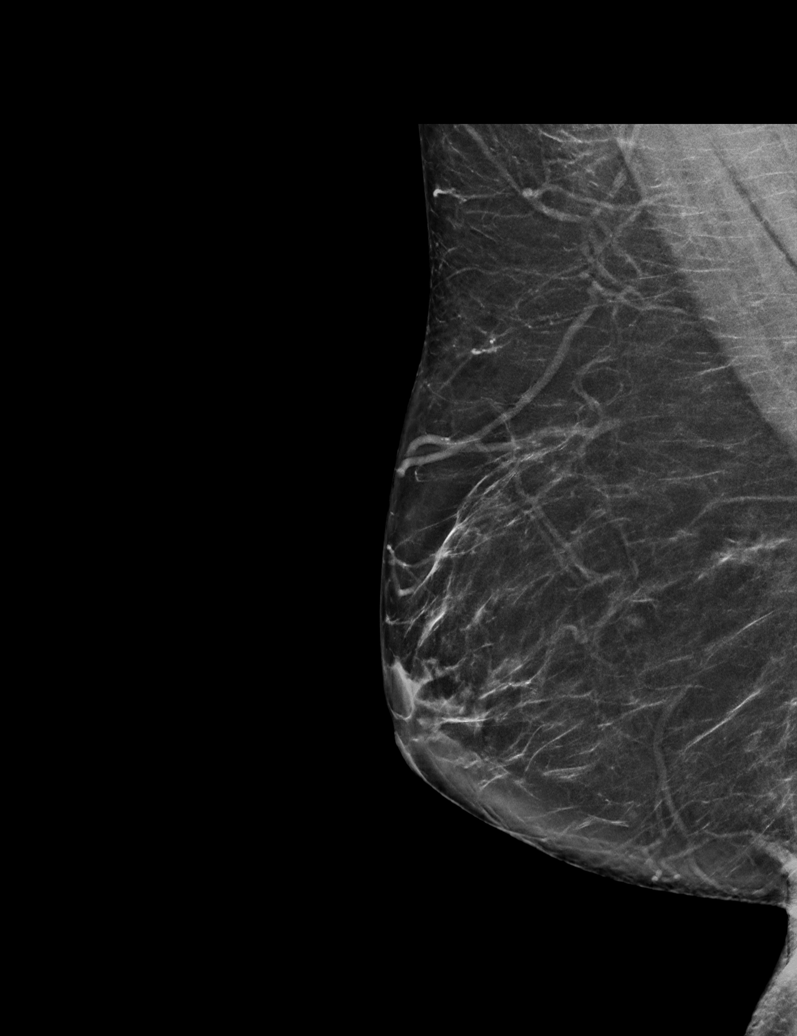

[R MLO tomo · tomo slice 36/71.0]
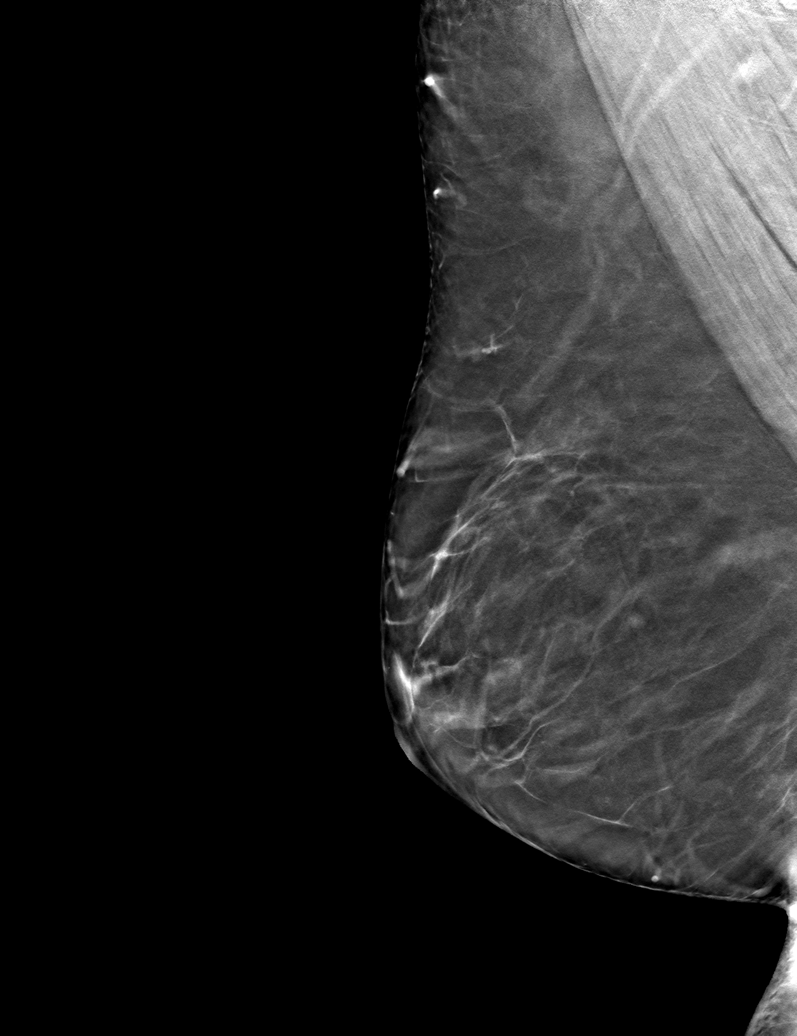

[6 of 30 positions shown; findings below may reference images not displayed]

ACR Breast Density Category b: There are scattered areas of
fibroglandular density.
FINDINGS: A BB has been placed along the lateral anterior aspect of the left
breast indicating the focally tender site of concern. There are no
suspicious mammographic findings deep to the marker. No suspicious
calcifications, masses or areas of distortion are seen in the
bilateral breasts.

Mammographic images were processed with CAD.

Ultrasound targeted to the left breast at 2 o'clock, 3 cm from the
nipple, demonstrates normal fibroglandular tissue. No masses or
suspicious areas of shadowing are identified.
IMPRESSION: 1. There are no suspicious mammographic or targeted sonographic
abnormalities at the tender site of concern in the left breast.

2.  No mammographic evidence of malignancy in the bilateral breasts.

RECOMMENDATION:
1. Clinical follow-up recommended for the tender area of concern in
the lateral left breast. Any further workup should be based on
clinical grounds.

2.  Screening mammogram in one year.(Code:20-I-VS7)

I have discussed the findings and recommendations with the patient.
If applicable, a reminder letter will be sent to the patient
regarding the next appointment.

BI-RADS CATEGORY  1: Negative.

## 2022-10-15 ENCOUNTER — Ambulatory Visit: Payer: Self-pay | Attending: Internal Medicine | Admitting: Internal Medicine

## 2022-10-15 ENCOUNTER — Other Ambulatory Visit (HOSPITAL_COMMUNITY)
Admission: RE | Admit: 2022-10-15 | Discharge: 2022-10-15 | Disposition: A | Discharge status: Home / Self Care | Payer: Self-pay | Source: Ambulatory Visit | Attending: Internal Medicine | Admitting: Internal Medicine

## 2022-10-15 ENCOUNTER — Encounter: Payer: Self-pay | Admitting: Internal Medicine

## 2022-10-15 VITALS — BP 115/76 | HR 57 | Temp 98.3°F | Ht 63.0 in | Wt 183.0 lb

## 2022-10-15 DIAGNOSIS — Z0000 Encounter for general adult medical examination without abnormal findings: Secondary | ICD-10-CM

## 2022-10-15 DIAGNOSIS — Z6832 Body mass index (BMI) 32.0-32.9, adult: Secondary | ICD-10-CM

## 2022-10-15 DIAGNOSIS — Z124 Encounter for screening for malignant neoplasm of cervix: Secondary | ICD-10-CM

## 2022-10-15 DIAGNOSIS — Z1211 Encounter for screening for malignant neoplasm of colon: Secondary | ICD-10-CM

## 2022-10-15 DIAGNOSIS — Z2821 Immunization not carried out because of patient refusal: Secondary | ICD-10-CM

## 2022-10-15 DIAGNOSIS — N63 Unspecified lump in unspecified breast: Secondary | ICD-10-CM

## 2022-10-15 DIAGNOSIS — Z8639 Personal history of other endocrine, nutritional and metabolic disease: Secondary | ICD-10-CM

## 2022-10-15 DIAGNOSIS — Z1159 Encounter for screening for other viral diseases: Secondary | ICD-10-CM

## 2022-10-15 DIAGNOSIS — E669 Obesity, unspecified: Secondary | ICD-10-CM

## 2022-10-15 DIAGNOSIS — R7303 Prediabetes: Secondary | ICD-10-CM

## 2022-10-15 NOTE — Patient Instructions (Signed)
Cuidados preventivos en las mujeres de 40 a 64 aos de edad Preventive Care 44-52 Years Old, Female Los cuidados preventivos hacen referencia a las opciones en cuanto al estilo de vida y a las visitas al mdico, las cuales pueden promover la salud y Counsellor. Las visitas de cuidado preventivo tambin se denominan exmenes de Health visitor. Qu puedo esperar para mi visita de cuidado preventivo? Asesoramiento Su mdico puede preguntarle acerca de: Antecedentes mdicos, incluidos los siguientes: Problemas mdicos pasados. Antecedentes mdicos familiares. Antecedentes de embarazo. Salud actual, incluido lo siguiente: Ciclo menstrual. Mtodos anticonceptivos. Su bienestar emocional. Training and development officer y las relaciones personales. Actividad sexual y salud sexual. Doran Clay de vida, incluido lo siguiente: Consumo de alcohol, nicotina, tabaco o drogas. Acceso a armas de fuego. Hbitos de alimentacin, ejercicio y sueo. Su trabajo y [place] laboral. Uso de pantalla solar. Cuestiones de seguridad, como el uso de cinturn de seguridad y casco de Scientist, research (physical sciences). Examen fsico El mdico revisar lo siguiente: Diplomatic Services operational officer y [place]. Estos pueden usarse para calcular el IMC (ndice de masa corporal). El [facility] es una medicin que indica si tiene un peso saludable. Circunferencia de la cintura. Es Neomia Dear medicin alrededor de Lobbyist. Esta medicin tambin indica si tiene un peso saludable y puede ayudar a predecir su riesgo de padecer ciertas enfermedades, como diabetes tipo 2 y presin arterial alta. Frecuencia cardaca y presin arterial. Temperatura corporal. Piel para detectar manchas anormales. Qu vacunas necesito?  Las vacunas se aplican a varias edades, segn un cronograma. El [employer] recomendar vacunas segn su edad, sus antecedentes mdicos, su estilo de vida y [address], como los viajes o el lugar donde trabaja. Qu pruebas necesito? Pruebas de deteccin El mdico puede recomendar  pruebas de deteccin de ciertas afecciones. Esto puede incluir: Niveles de lpidos y colesterol. Pruebas de deteccin de la diabetes. Esto se Physiological scientist un control del azcar en la sangre (glucosa) despus de no haber comido durante un periodo de tiempo (ayuno). Examen plvico y prueba de Papanicolaou. Prueba de hepatitis B. Prueba de hepatitis C. Prueba del VIH (virus de inmunodeficiencia humana). Pruebas de infecciones de transmisin sexual (ITS), si est en riesgo. Pruebas de deteccin de cncer de pulmn. Pruebas de deteccin de cncer colorrectal. Mamografa. Hable con su mdico sobre cundo debe comenzar a Health and safety inspector de [place] regular. Esto depende de si tiene antecedentes familiares de cncer de mama o no. Pruebas de deteccin de cncer relacionado con las mutaciones del BRCA. Es posible que se las deba realizar si tiene antecedentes de cncer de mama, de ovario, de trompas o peritoneal. Densitometra sea. Esto se realiza para detectar osteoporosis. Hable con su mdico [employer], las opciones de tratamiento y, si corresponde, la necesidad de Education officer, environmental ms pruebas. Siga estas instrucciones en su casa: Comida y bebida  Siga una dieta que incluya frutas y verduras frescas, cereales integrales, protenas magras y productos lcteos descremados. Tome los suplementos vitamnicos y [employer] se lo haya indicado el mdico. No beba alcohol si: Su mdico le indica no hacerlo. Est embarazada, puede estar embarazada o est tratando de [place]. Si bebe alcohol: Limite la cantidad que consume de 0 a 1 medida por da. Sepa cunta cantidad de alcohol hay en las bebidas que toma. En los [address], una medida equivale a una botella de cerveza de 12 oz (355 ml), un vaso de vino de 5 oz (148 ml) o un vaso de una bebida alcohlica de alta graduacin de 1 oz (44  ml). Estilo de vida [employer] dientes a la maana y a la noche con pasta  dental con fluoruro. Use hilo dental una vez al da. Haga al menos 30 minutos de ejercicio, 5 o ms [address]. No consuma ningn producto que contenga nicotina o tabaco. Estos productos incluyen cigarrillos, tabaco para Theatre manager y aparatos de vapeo, como los Administrator, Civil Service. Si necesita ayuda para dejar de fumar, consulte al mdico. No consuma drogas. Si es sexualmente activa, practique sexo seguro. Use un condn u otra forma de proteccin para prevenir las infecciones de transmisin sexual (ITS). Si no desea quedar embarazada, use un mtodo anticonceptivo. Si busca un embarazo, realice una consulta previa al [employer] con el mdico. Tome aspirina nicamente como se lo haya indicado el mdico. Asegrese de que comprende qu cantidad y cul presentacin debe tomar. Trabaje con el mdico para averiguar si es seguro y beneficioso para usted tomar aspirina a diario. Busque maneras saludables de Charity fundraiser, tales como: Meditacin, yoga o Optometrist. Lleve un diario personal. Hable con una persona confiable. Pase tiempo con amigos y familiares. Minimice la exposicin a la radiacin UV para reducir el riesgo de cncer de piel. Seguridad [place] siempre el cinturn de seguridad al conducir o viajar en un vehculo. No conduzca: Si ha estado bebiendo alcohol. No viaje con un conductor que ha estado bebiendo. Si est cansada o distrada. Mientras est enviando mensajes de texto. Si ha estado usando sustancias o drogas que alteran la funcin mental. Use un casco y otros equipos de proteccin durante las actividades deportivas. Si tiene armas de fuego en su casa, asegrese de seguir todos los procedimientos de seguridad correspondientes. Busque ayuda si fue vctima de abuso fsico o abuso sexual. Cundo volver? Visite al mdico una vez al ao para una visita anual de control de bienestar. Pregntele al mdico con qu frecuencia debe realizarse un control de la vista y los  dientes. Mantenga su esquema de vacunacin al da. Esta informacin no tiene Theme park manager el consejo del mdico. Asegrese de hacerle al mdico cualquier pregunta que tenga. Document Revised: 08/07/2020 Document Reviewed: 08/07/2020 Elsevier Patient Education  2024 Elsevier Inc.  Alimentacin saludable en los adultos Healthy Eating, Adult [place] alimentacin saludable puede ayudarlo a Barista y Pharmacologist un peso saludable, reducir el riesgo de tener enfermedades crnicas y vivir Neomia Dear vida larga y productiva. Es importante que siga una modalidad de alimentacin saludable. Sus necesidades nutricionales y calricas deben satisfacerse principalmente con distintos alimentos ricos en nutrientes. Consejos para seguir Surveyor, minerals Lea las etiquetas de los alimentos Lea las etiquetas y elija las que digan lo siguiente: Productos reducidos en sodio o con bajo contenido de [place]. Jugos con 100 % jugo de fruta. Alimentos con bajo contenido de grasas saturadas (menos de 3 g por porcin) y alto contenido de grasas poliinsaturadas y Mining engineer. Alimentos con cereales integrales, como trigo integral, trigo partido, arroz integral y arroz salvaje. Cereales integrales fortificados con cido flico. Esto se recomienda a las mujeres embarazadas o que desean quedar embarazadas. Lea las etiquetas y no coma ni beba lo siguiente: Alimentos o bebidas con azcar agregada. Estos incluyen los alimentos que contienen azcar moreno, endulzante a base de maz, jarabe de maz, dextrosa, fructosa, glucosa, jarabe de maz de alta fructosa, miel, azcar invertido, lactosa, jarabe de [place], maltosa, [place], azcar sin refinar, sacarosa, trehalosa y azcar turbinado. Limite el consumo de azcar agregada a menos del 10 % del total de caloras diarias. No consuma ms que las siguientes  cantidades de azcar agregada por da: 6 cucharaditas (25 g) para las mujeres. 9 cucharaditas (38 g) para los hombres. Los alimentos que  contienen almidones y cereales refinados o procesados. Los productos de cereales refinados, como harina blanca, harina de maz desgerminada, pan blanco y arroz blanco. Al ir de compras Elija refrigerios ricos en nutrientes, como verduras, frutas enteras y frutos secos. Evite los refrigerios con alto contenido de caloras y International aid/development worker, como las papas fritas, los refrigerios frutales y los caramelos. Use alios y productos para untar a base de aceite con los Publishing rights manager de grasas slidas como la [place], la [place], la crema agria o el queso crema. Limite las salsas, las mezclas y los productos "instantneos" preelaborados como el arroz saborizado, los fideos instantneos y las pastas listas para comer. Pruebe ms fuentes de protena vegetal, como tofu, tempeh, frijoles negros, edamame, lentejas, frutos secos y semillas. Explore planes de alimentacin como la dieta mediterrnea o la dieta vegetariana. Pruebe salsas cardiosaludables hechas con frijoles y grasas saludables, como hummus y [place]. Las verduras van muy bien con ellas. Al cocinar Use aceite para Designer, multimedia de grasas slidas como [place], margarina o [place]. En lugar de frer, trate de cocinar en el horno, en la plancha o en la parrilla, o hervir los alimentos. Retire la parte grasa de las carnes antes de cocinarlas. Cocine las verduras al vapor en agua o caldo. Planificacin de las comidas  En las comidas, imagine dividir su plato en cuartos: La mitad del plato tiene frutas y verduras. Un cuarto del plato tiene cereales integrales. Un cuarto del plato tiene protena, especialmente carnes [place], aves, huevos, tofu, frijoles o frutos secos. Incluya lcteos descremados en su dieta diaria. Estilo de vida Elija opciones saludables en todos los mbitos, como en el hogar, el [place], la [place], los restaurantes y [place]. Prepare los alimentos de un modo seguro: Lvese las manos despus de  manipular carnes crudas. Donde prepare alimentos, mantenga las superficies limpias lavndolas regularmente con agua caliente y [place]. Mantenga las carnes crudas separadas de los alimentos que estn listos para comer como las frutas y las verduras. Cocine los frutos de mar, carnes, aves y Production manager la temperatura recomendada. Consiga un termmetro para alimentos. Almacene los alimentos a temperaturas seguras. En general: Mantenga los alimentos fros a una temperatura de 40 F (4,4 C) o inferior. Mantenga los alimentos calientes a una temperatura de 140 F (60 C) o superior. Mantenga el congelador a una temperatura de 0 F (-17,8 C) o inferior. Los alimentos no son seguros para su consumo cuando han estado a una temperatura de entre 40 y 140 F (4.4 y 60 C) por ms de 2 horas. Qu alimentos debo comer? Frutas Propngase comer entre 1 y 2 tazas de frutas frescas, Primary school teacher (en su jugo natural) o Primary school teacher. Una taza de fruta equivale a 1 manzana pequea, 1 banana grande, 8 fresas grandes, 1 taza (237 g) de fruta enlatada,  taza (82 g) de fruta seca o 1 taza (240 ml) de jugo al 100 %. Verduras Propngase comer de 2 a 4 tazas de verduras frescas y Primary school teacher, incluyendo diferentes variedades y colores. Una taza de verduras equivale a 1 taza (91 g) de brcoli o coliflor, 2 zanahorias medianas, 2 tazas (150 g) de verduras de [employer] crudas, 1 tomate grande, 1 pimiento morrn grande, 1 batata grande o 1 patata blanca mediana. Cereales Propngase comer el equivalente a entre 4  y 10 onzas de cereales integrales por da. Algunos ejemplos de equivalentes a 1 onza de cereales son 1 rebanada de pan, 1 taza (40 g) de cereal listo para comer, 3 tazas (24 g) de palomitas de maz o  taza (93 g) de arroz cocido. Carnes y otras protenas Propngase comer el equivalente a entre 5 y 7  onzas de protena por Futures trader. Algunos ejemplos de equivalentes a 1 onza de protenas incluyen 1  huevo,  oz de frutos secos (12 almendras, 24 pistachos o 7 mitades de nueces), 1/4 taza (90 g) de frijoles cocidos, 6 cucharadas (90 g) de hummus o 1 cucharada (16 g) de [place] de man. Un corte de carne o pescado del tamao de un mazo de cartas equivale aproximadamente a 3 a 4 onzas (85 g). De las protenas que consume cada semana, intente que al menos 8 onzas (227 g) sean frutos de mar. Esto equivale a unas 2 porciones por semana. Esto incluye salmn, trucha, arenque y anchoas. Lcteos [employer] a 3 tazas de lcteos descremados o con bajo contenido de Museum/gallery curator. Algunos ejemplos de equivalentes a 1 taza de lcteos son 1 taza (240 ml) de leche, 8 onzas (250 g) de yogur, 1 onzas (44 g) de queso natural o 1 taza (240 ml) de leche de soja fortificada. Grasas y aceites Propngase consumir alrededor de 5 cucharaditas (21 g) de grasas y Acupuncturist. Elija grasas monoinsaturadas, como el aceite de canola y de [place], la [place] con aceite de [place] o de [place], [place], [place] de man y Games developer de los frutos secos, o bien grasas poliinsaturadas, como el aceite de [place], maz y soja, nueces, piones, semillas de ssamo, semillas de girasol y semillas de lino. Bebidas Propngase beber 6 vasos de 8 onzas de Warehouse manager. Limite el caf a entre 3 y 5 tazas de ocho onzas por Futures trader. Limite el consumo de bebidas con cafena que tengan caloras agregadas, como los refrescos y las bebidas energizantes. Si bebe alcohol: Limite la cantidad que bebe a lo siguiente: De 0 a 1 medida al da si es [place]. De 0 a 2 medidas al da si es varn. Sepa cunta cantidad de alcohol hay en las bebidas que toma. En los [address], una medida es una botella de cerveza de 12 oz (355 ml), un vaso de vino de 5 oz (148 ml) o un vaso de una bebida alcohlica de alta graduacin de 1 oz (44 ml). Condimentos y otros alimentos Trate de no agregar demasiada sal a los alimentos. Trate de  usar hierbas y especias en lugar de sal. Trate de no agregar azcar a los alimentos. Esta informacin se basa en las pautas de nutricin de los [place] [employer]. Para obtener ms informacin, visite [email or web address]. Las Information systems manager. Es posible que necesite cantidades diferentes. Esta informacin no tiene Theme park manager el consejo del mdico. Asegrese de hacerle al mdico cualquier pregunta que tenga. Document Revised: 11/17/2021 Document Reviewed: 11/17/2021 Elsevier Patient Education  2024 [employer].

## 2022-10-15 NOTE — Progress Notes (Signed)
Patient ID: Regina Calhoun, female    DOB: [birth year]  MRN: [id number]  CC: Annual Exam (Physical./Requesting blood work for thyroid /No to flu vax. Yes to pap for another appt.)   Subjective: Regina Calhoun is a 52 y.o. female who presents for physical.  Last seen 01/2021 Her concerns today include:   AMN Language interpreter used during this encounter. #Eric [id number]  HM:  no to flu shot.  Due for COVID booster, declines.  GYN History:  Pt is G3P2 (miscarriage) Any hx of abn paps?: no.  Polypoid lesion seen in cervical oz on last pap 3 yrs ago.  Pt was referred to Gynecology to have this evaluated.  GYN also said it was a polyp and no measures needed if pt not having any abnormal vaginal bleeding Menses regular or irregular?: cycles stopped 2001 How long does menses last? NA Menstrual flow light or heavy?: NA Method of birth control?:  NA Any vaginal dischg at this time?: no Dysuria?: no Any hx of STI?: no Sexually active with how many partners: no current  Desires STI screen:  no Last MMG: 07/2022 - RT breast mass stable; plan for repeat MMG 12/2022. Does not do routine breast exam Family hx of uterine, cervical or breast cancer?:  no  Request thyroid level check.  Previous hx of Graves.  Not on meds for yr.  Last thyroid panel checked 12/2020 was nl.    PreDM/Obesity:  not getting in any exercise, feels she does okay with eating habits.   Patient Active Problem List   Diagnosis Date Noted   Cervical polyp 09/14/2019   Elevated LDL cholesterol level 02/11/2017   Prediabetes 02/11/2017   Graves disease 12/10/2014   Breast pain in female 05/16/2012     Current Outpatient Medications on File Prior to Visit  Medication Sig Dispense Refill   ciclopirox (PENLAC) 8 % solution Apply topically at bedtime. Apply over nail. Apply daily over previous coat. After seven (7) days, may remove with alcohol and continue cycle. (Patient not taking: Reported on  10/15/2022) 6.6 mL 1   terbinafine (LAMISIL) 250 MG tablet Take 1 tablet (250 mg total) by mouth daily. (Patient not taking: Reported on 01/09/2021) 45 tablet 0   No current facility-administered medications on file prior to visit.    No Known Allergies  Social History   Socioeconomic History   Marital status: Married    Spouse name: Not on file   Number of children: Not on file   Years of education: Not on file   Highest education level: 11th grade  Occupational History   Not on file  Tobacco Use   Smoking status: Never   Smokeless tobacco: Never  Vaping Use   Vaping status: Never Used  Substance and Sexual Activity   Alcohol use: No    Alcohol/week: 0.0 standard drinks of alcohol   Drug use: No   Sexual activity: Not Currently    Birth control/protection: Post-menopausal  Other Topics Concern   Not on file  Social History Narrative   Not on file   Social Determinants of Health   Financial Resource Strain: Not on file  Food Insecurity: No Food Insecurity (12/10/2021)   Hunger Vital Sign    Worried About Running Out of Food in the Last Year: Never true    Ran Out of Food in the Last Year: Never true  Transportation Needs: No Transportation Needs (12/10/2021)   PRAPARE - Transportation    Lack of Transportation (  Medical): No    Lack of Transportation (Non-Medical): No  Physical Activity: Not on file  Stress: Not on file  Social Connections: Not on file  Intimate Partner Violence: Not on file    Family History  Problem Relation Age of Onset   Hypothyroidism Mother    Polycystic ovary syndrome Daughter    Diabetes Maternal Grandmother    Clotting disorder Paternal Grandmother    Breast cancer Neg Hx     Past Surgical History:  Procedure Laterality Date   NO PAST SURGERIES      ROS: Review of Systems  Constitutional:  Negative for activity change.  HENT:  Negative for congestion, hearing loss and sore throat.   Eyes:  Negative for visual disturbance.        Had eye exam 07/2022.  Rxn glasses  Respiratory:  Negative for cough and shortness of breath.   Cardiovascular:  Negative for chest pain.  Gastrointestinal:  Negative for blood in stool.       Moving bowels okay.  Genitourinary:  Negative for difficulty urinating.  Musculoskeletal:  Negative for arthralgias.  Psychiatric/Behavioral:  Negative for dysphoric mood.    PHYSICAL EXAM: BP 115/76 (BP Location: Left Arm, Patient Position: Sitting, Cuff Size: Normal)   Pulse (!) 57   Temp 98.3 F (36.8 C) (Oral)   Ht 5\' 3"  (1.6 m)   Wt 183 lb (83 kg)   LMP 02/13/2018   SpO2 96%   BMI 32.42 kg/m   Wt Readings from Last 3 Encounters:  10/15/22 183 lb (83 kg)  12/10/21 183 lb 14.4 oz (83.4 kg)  01/09/21 178 lb 12.8 oz (81.1 kg)    Physical Exam  General appearance - alert, well appearing, middle age Hispanic female and in no distress Mental status - normal mood, behavior, speech, dress, motor activity, and thought processes Eyes - pupils equal and reactive, extraocular eye movements intact Ears - bilateral TM's and external ear canals normal Nose - normal and patent, no erythema, discharge or polyps Mouth - mucous membranes moist, pharynx normal without lesions Neck - supple, no significant adenopathy Lymphatics - no palpable lymphadenopathy, no hepatosplenomegaly Chest - clear to auscultation, no wheezes, rales or rhonchi, symmetric air entry Heart - normal rate, regular rhythm, normal S1, S2, no murmurs, rubs, clicks or gallops Abdomen - soft, nontender, nondistended, no masses or organomegaly Breasts -CMA Clarissa present for breast and pelvic exam: breasts appear normal, no suspicious masses, no skin or nipple changes or axillary nodes.  Noted to have circular hyperpigmented 0.4 mm macular spot below right nipple.  Patient states she has had this for years and it has not changed in size. Pelvic - normal external genitalia, vulva, vagina, uterus and adnexa.  Polypoid/flesh color lesion  seen again within the cervical os.  Does not protrude outside of the os. Musculoskeletal - no joint tenderness, deformity or swelling Extremities -no lower extremity edema.  Good peripheral pulses.  Noted to have spider veins in the lower extremities. Skin -mild hirsutism on both sides of the face.      Latest Ref Rng & Units 12/19/2020   11:20 AM 03/30/2019    4:48 PM 11/08/2016   12:14 PM  CMP  Glucose 70 - 99 mg/dL 629  528  413   BUN 6 - 24 mg/dL 15  12  14    Creatinine 0.57 - 1.00 mg/dL 2.44  0.10  2.72   Sodium 134 - 144 mmol/L 142  143  141   Potassium 3.5 -  5.2 mmol/L 5.1  4.3  4.8   Chloride 96 - 106 mmol/L 105  107  102   CO2 20 - 29 mmol/L 22  20  25    Calcium 8.7 - 10.2 mg/dL 9.5  9.5  40.1   Total Protein 6.0 - 8.5 g/dL 7.2  7.3  7.7   Total Bilirubin 0.0 - 1.2 mg/dL 0.3  0.3  0.3   Alkaline Phos 44 - 121 IU/L 126  101  91   AST 0 - 40 IU/L 23  23  27    ALT 0 - 32 IU/L 22  25  37    Lipid Panel     Component Value Date/Time   CHOL 202 (H) 12/19/2020 1120   TRIG 205 (H) 12/19/2020 1120   HDL 53 12/19/2020 1120   CHOLHDL 3.8 12/19/2020 1120   CHOLHDL 3.3 Ratio 11/23/2007 2101   VLDL 12 11/23/2007 2101   LDLCALC 113 (H) 12/19/2020 1120    CBC    Component Value Date/Time   WBC 8.6 12/19/2020 1120   WBC 7.7 05/05/2015 1526   RBC 5.15 12/19/2020 1120   RBC 4.86 05/05/2015 1526   HGB 15.1 12/19/2020 1120   HCT 44.2 12/19/2020 1120   PLT 315 12/19/2020 1120   MCV 86 12/19/2020 1120   MCH 29.3 12/19/2020 1120   MCH 28.8 05/05/2015 1526   MCHC 34.2 12/19/2020 1120   MCHC 33.6 05/05/2015 1526   RDW 12.6 12/19/2020 1120   LYMPHSABS 3,003 05/05/2015 1526   MONOABS 462 05/05/2015 1526   EOSABS 539 (H) 05/05/2015 1526   BASOSABS 77 05/05/2015 1526    ASSESSMENT AND PLAN: 1. Annual physical exam   2. Pap smear for cervical cancer screening - Cytology - PAP  3. Breast mass seen on mammogram Patient to keep appointment for 6 mth follow-up mammogram in  November 2024 to in sure stability Advised patient to monitor the mole on the right breast.  If it increases in size or changes in color, she should let me know.  4. Obesity (BMI 30.0-34.9) Patient advised to eliminate sugary drinks from the diet, cut back on portion sizes especially of white carbohydrates, eat more white lean meat like chicken [place] and seafood instead of beef or pork and incorporate fresh fruits and vegetables into the diet daily. Encouraged her to get in some form of moderate intensity exercise at least 3 to 5 days a week for 30 minutes. - CBC - Comprehensive metabolic panel - Lipid panel - Hemoglobin A1c  5. Prediabetes See #4 above - Hemoglobin A1c  6. History of Graves' disease - TSH+T4F+T3Free  7. Screening for colon cancer - Fecal occult blood, imunochemical([facility])  8. Influenza vaccination declined   9. COVID-19 vaccination declined   10. Need for hepatitis C screening test - Hepatitis C Antibody    Patient was given the opportunity to ask questions.  Patient verbalized understanding of the plan and was able to repeat key elements of the plan.   This documentation was completed using Paediatric nurse.  Any transcriptional errors are unintentional.  No orders of the defined types were placed in this encounter.    Requested Prescriptions    No prescriptions requested or ordered in this encounter    No follow-ups on file.  Jonah Blue, MD, FACP

## 2022-10-16 LAB — COMPREHENSIVE METABOLIC PANEL
ALT: 25 IU/L (ref 0–32)
AST: 22 IU/L (ref 0–40)
Albumin: 4.4 g/dL (ref 3.8–4.9)
Alkaline Phosphatase: 107 IU/L (ref 44–121)
BUN/Creatinine Ratio: 21 (ref 9–23)
BUN: 14 mg/dL (ref 6–24)
Bilirubin Total: 0.5 mg/dL (ref 0.0–1.2)
CO2: 22 mmol/L (ref 20–29)
Calcium: 9.4 mg/dL (ref 8.7–10.2)
Chloride: 105 mmol/L (ref 96–106)
Creatinine, Ser: 0.66 mg/dL (ref 0.57–1.00)
Globulin, Total: 2.9 g/dL (ref 1.5–4.5)
Glucose: 97 mg/dL (ref 70–99)
Potassium: 4.6 mmol/L (ref 3.5–5.2)
Sodium: 141 mmol/L (ref 134–144)
Total Protein: 7.3 g/dL (ref 6.0–8.5)
eGFR: 105 mL/min/{1.73_m2} (ref >59)

## 2022-10-16 LAB — CBC
Hematocrit: 44.6 % (ref 34.0–46.6)
Hemoglobin: 14.8 g/dL (ref 11.1–15.9)
MCH: 29.2 pg (ref 26.6–33.0)
MCHC: 33.2 g/dL (ref 31.5–35.7)
MCV: 88 fL (ref 79–97)
Platelets: 286 10*3/uL (ref 150–450)
RBC: 5.07 x10E6/uL (ref 3.77–5.28)
RDW: 12.6 % (ref 11.7–15.4)
WBC: 6.6 10*3/uL (ref 3.4–10.8)

## 2022-10-16 LAB — TSH+T4F+T3FREE
Free T4: 1.1 ng/dL (ref 0.82–1.77)
T3, Free: 3.6 pg/mL (ref 2.0–4.4)
TSH: 2.84 u[IU]/mL (ref 0.450–4.500)

## 2022-10-16 LAB — LIPID PANEL
Chol/HDL Ratio: 3.8 ratio (ref 0.0–4.4)
Cholesterol, Total: 203 mg/dL — ABNORMAL HIGH (ref 100–199)
HDL: 54 mg/dL (ref >39)
LDL Chol Calc (NIH): 130 mg/dL — ABNORMAL HIGH (ref 0–99)
Triglycerides: 104 mg/dL (ref 0–149)
VLDL Cholesterol Cal: 19 mg/dL (ref 5–40)

## 2022-10-16 LAB — HEMOGLOBIN A1C
Est. average glucose Bld gHb Est-mCnc: 123 mg/dL
Hgb A1c MFr Bld: 5.9 % — ABNORMAL HIGH (ref 4.8–5.6)

## 2022-10-16 LAB — HEPATITIS C ANTIBODY: Hep C Virus Ab: NONREACTIVE

## 2022-10-18 ENCOUNTER — Other Ambulatory Visit: Payer: Self-pay

## 2022-10-18 ENCOUNTER — Telehealth: Payer: Self-pay

## 2022-10-18 DIAGNOSIS — N6310 Unspecified lump in the right breast, unspecified quadrant: Secondary | ICD-10-CM

## 2022-10-18 NOTE — Telephone Encounter (Signed)
Telephoned patient with interpreter, Regina Calhoun. Left a voice message with [facility] contact information.

## 2022-10-21 LAB — CYTOLOGY - PAP
Comment: NEGATIVE
Diagnosis: NEGATIVE
High risk HPV: NEGATIVE

## 2022-11-23 LAB — FECAL OCCULT BLOOD, IMMUNOCHEMICAL: Fecal Occult Bld: NEGATIVE

## 2022-12-23 ENCOUNTER — Ambulatory Visit
Admission: RE | Admit: 2022-12-23 | Discharge: 2022-12-23 | Disposition: A | Discharge status: Home / Self Care | Payer: No Typology Code available for payment source | Source: Ambulatory Visit | Attending: Obstetrics and Gynecology | Admitting: Obstetrics and Gynecology

## 2022-12-23 ENCOUNTER — Ambulatory Visit: Payer: Self-pay | Admitting: Hematology and Oncology

## 2022-12-23 VITALS — BP 130/90

## 2022-12-23 DIAGNOSIS — N6310 Unspecified lump in the right breast, unspecified quadrant: Secondary | ICD-10-CM

## 2022-12-23 NOTE — Progress Notes (Signed)
[name] is a 52 y.o. female who presents to [facility] clinic today with no complaints. Follow up stable likely benign right breast mass.    Pap Smear: Pap not smear completed today. Last Pap smear was 10/15/2022 and was normal. Per patient has no history of an abnormal Pap smear. Last Pap smear result is available in Epic.   Physical exam: Breasts Breasts symmetrical. No skin abnormalities bilateral breasts. No nipple retraction bilateral breasts. No nipple discharge bilateral breasts. No lymphadenopathy. No lumps palpated bilateral breasts.     MM DIAG BREAST TOMO UNI RIGHT  Result Date: 07/09/2022 CLINICAL DATA:  Short-term follow-up for a likely benign right breast mass. EXAM: DIGITAL DIAGNOSTIC UNILATERAL RIGHT MAMMOGRAM WITH TOMOSYNTHESIS; ULTRASOUND RIGHT BREAST LIMITED TECHNIQUE: Right digital diagnostic mammography and breast tomosynthesis was performed.; Targeted ultrasound examination of the right breast was performed COMPARISON:  Previous exam(s). ACR Breast Density Category a: The breasts are almost entirely fatty. FINDINGS: Spot compression tomosynthesis images through the central right breast demonstrates a stable bilobed oval mass. Ultrasound targeted to the right breast at 12 o'clock, 1 cm from the nipple demonstrates a stable near anechoic oval mass measuring 3 x 2 x 3 mm, previously measuring 3 x 2 x 3 mm. IMPRESSION: 1.  Stable likely benign right breast mass. RECOMMENDATION: Bilateral diagnostic mammogram and right breast ultrasound in November of 2024. I have discussed the findings and recommendations with the patient. If applicable, a reminder letter will be sent to the patient regarding the next appointment. BI-RADS CATEGORY  3: Probably benign. Electronically Signed   By: Frederico Hamman M.D.   On: 07/09/2022 15:12  MS DIGITAL DIAG TOMO UNI RIGHT  Result Date: 01/06/2022 CLINICAL DATA:  52 year old female recalled from screening mammogram dated 12/10/2021 for a  possible right breast mass. EXAM: DIGITAL DIAGNOSTIC UNILATERAL RIGHT MAMMOGRAM WITH TOMOSYNTHESIS; ULTRASOUND RIGHT BREAST LIMITED TECHNIQUE: Right digital diagnostic mammography and breast tomosynthesis was performed.; Targeted ultrasound examination of the right breast was performed COMPARISON:  Previous exam(s). ACR Breast Density Category b: There are scattered areas of fibroglandular density. FINDINGS: There is a persistent oval, circumscribed equal density mass in the subareolar right breast. Targeted ultrasound is performed, showing an oval, circumscribed hypoechoic mass at the 12 o'clock position 1 cm from the nipple. It measures 3 x 3 x 2 mm. This likely corresponds with the mammographic finding. IMPRESSION: Probably benign right breast mass corresponding with the screening mammographic findings. RECOMMENDATION: Diagnostic right breast mammogram and ultrasound in 6 months. I have discussed the findings and recommendations with the patient. If applicable, a reminder letter will be sent to the patient regarding the next appointment. BI-RADS CATEGORY  3: Probably benign. Electronically Signed   By: Sande Brothers M.D.   On: 01/06/2022 15:06  MS DIGITAL SCREENING TOMO BILATERAL  Result Date: 12/14/2021 CLINICAL DATA:  Screening. EXAM: DIGITAL SCREENING BILATERAL MAMMOGRAM WITH TOMOSYNTHESIS AND CAD TECHNIQUE: Bilateral screening digital craniocaudal and mediolateral oblique mammograms were obtained. Bilateral screening digital breast tomosynthesis was performed. The images were evaluated with computer-aided detection. COMPARISON:  Previous exam(s). ACR Breast Density Category b: There are scattered areas of fibroglandular density. FINDINGS: In the right breast, a possible mass warrants further evaluation. In the left breast, no findings suspicious for malignancy. IMPRESSION: Further evaluation is suggested for a possible mass in the right breast. RECOMMENDATION: Diagnostic mammogram and possibly  ultrasound of the right breast. (Code:FI-R-17M) The patient will be contacted regarding the findings, and additional imaging will be scheduled. BI-RADS CATEGORY  0: Incomplete. Need additional imaging evaluation and/or prior mammograms for comparison. Electronically Signed   By: Gerome Sam III M.D.   On: 12/14/2021 13:30   MS DIGITAL DIAG TOMO BILAT  Result Date: 07/24/2020 CLINICAL DATA:  52 year old female presenting for evaluation of intermittent focal burning pain in the left breast for several months. EXAM: DIGITAL DIAGNOSTIC BILATERAL MAMMOGRAM WITH TOMOSYNTHESIS AND CAD; ULTRASOUND LEFT BREAST LIMITED TECHNIQUE: Bilateral digital diagnostic mammography and breast tomosynthesis was performed. The images were evaluated with computer-aided detection.; Targeted ultrasound examination of the left breast was performed COMPARISON:  Previous exam(s). ACR Breast Density Category b: There are scattered areas of fibroglandular density. FINDINGS: A BB has been placed along the lateral anterior aspect of the left breast indicating the focally tender site of concern. There are no suspicious mammographic findings deep to the marker. No suspicious calcifications, masses or areas of distortion are seen in the bilateral breasts. Mammographic images were processed with CAD. Ultrasound targeted to the left breast at 2 o'clock, 3 cm from the nipple, demonstrates normal fibroglandular tissue. No masses or suspicious areas of shadowing are identified. IMPRESSION: 1. There are no suspicious mammographic or targeted sonographic abnormalities at the tender site of concern in the left breast. 2.  No mammographic evidence of malignancy in the bilateral breasts. RECOMMENDATION: 1. Clinical follow-up recommended for the tender area of concern in the lateral left breast. Any further workup should be based on clinical grounds. 2.  Screening mammogram in one year.(Code:SM-B-01Y) I have discussed the findings and recommendations with  the patient. If applicable, a reminder letter will be sent to the patient regarding the next appointment. BI-RADS CATEGORY  1: Negative. Electronically Signed   By: Frederico Hamman M.D.   On: 07/24/2020 11:09   MS DIGITAL SCREENING TOMO BILATERAL  Result Date: 06/14/2019 CLINICAL DATA:  Screening. EXAM: DIGITAL SCREENING BILATERAL MAMMOGRAM WITH TOMO AND CAD COMPARISON:  Previous exam(s). ACR Breast Density Category b: There are scattered areas of fibroglandular density. FINDINGS: There are no findings suspicious for malignancy. Images were processed with CAD. IMPRESSION: No mammographic evidence of malignancy. A result letter of this screening mammogram will be mailed directly to the patient. RECOMMENDATION: Screening mammogram in one year. (Code:SM-B-01Y) BI-RADS CATEGORY  1: Negative. Electronically Signed   By: Elberta Fortis M.D.   On: 06/14/2019 13:27      Pelvic/Bimanual Pap is not indicated today    Smoking History: Patient has never smoked and was not referred to quit line.    Patient Navigation: Patient education provided. Access to services provided for patient through BCCCP program. Natale Lay interpreter provided. No transportation provided   Colorectal Cancer Screening: Per patient has never had colonoscopy completed No complaints today.    Breast and Cervical Cancer Risk Assessment: Patient does not have family history of breast cancer, known genetic mutations, or radiation treatment to the chest before age 73. Patient does not have history of cervical dysplasia, immunocompromised, or DES exposure in-utero.  Risk Scores as of Encounter on 12/23/2022     Dondra Spry           5-year 0.86%   Lifetime 7.12%   This patient is [place] but has no documented birth country, so the [place] model used data from [place] patients to calculate their risk score. Document a birth country in the Demographics activity for a more accurate score.         Last calculated by Meryl Dare, CMA on 12/23/2022 at  8:14 AM  A: BCCCP exam without pap smear Complaint of right breast mass; follow up stable likely benign right breast mass.   P: Referred patient to the [facility] for a screening mammogram. Appointment scheduled 12/23/2022.  Ilda Basset A, NP 12/23/2022 8:09 AM

## 2022-12-23 NOTE — Patient Instructions (Signed)
Taught Regina Calhoun about self breast awareness and gave educational materials to take home. Patient did not need a Pap smear today due to last Pap smear was in 10/15/22 per patient. . Let her know BCCCP will cover Pap smears every 5 years unless has a history of abnormal Pap smears. Referred patient to the [facility] of [facility] for diagnostic mammogram. Appointment scheduled for 12/23/2022. Patient aware of appointment and will be there. Let patient know will follow up with her within the next couple weeks with results. Regina Calhoun verbalized understanding.  Pascal Lux, NP 8:22 AM

## 2023-01-13 ENCOUNTER — Ambulatory Visit: Payer: Self-pay | Attending: Physician Assistant | Admitting: Physician Assistant

## 2023-01-13 ENCOUNTER — Other Ambulatory Visit: Payer: Self-pay

## 2023-01-13 ENCOUNTER — Encounter: Payer: Self-pay | Admitting: Physician Assistant

## 2023-01-13 VITALS — BP 134/80 | HR 57 | Wt 184.8 lb

## 2023-01-13 DIAGNOSIS — R252 Cramp and spasm: Secondary | ICD-10-CM

## 2023-01-13 DIAGNOSIS — M549 Dorsalgia, unspecified: Secondary | ICD-10-CM

## 2023-01-13 MED ORDER — MELOXICAM 15 MG PO TABS
15.0000 mg | ORAL_TABLET | Freq: Every day | ORAL | 2 refills | Status: DC
  Filled 2023-01-13: qty 30, 30d supply, fill #0

## 2023-01-13 MED ORDER — METHOCARBAMOL 500 MG PO TABS
1000.0000 mg | ORAL_TABLET | Freq: Three times a day (TID) | ORAL | 0 refills | Status: DC | PRN
  Filled 2023-01-13: qty 90, 15d supply, fill #0

## 2023-01-13 NOTE — Progress Notes (Signed)
Patient ID: Regina Calhoun, female   DOB: [birth year], 52 y.o.   MRN: [id number]     Regina Calhoun, is a 52 y.o. female  [id number]  [id number]  DOB - 17-Apr-1970  Chief Complaint  Patient presents with   Back Pain       Subjective:   Regina Calhoun is a 52 y.o. female here today for About 3 week h/o lower back pain.  No urinary frequency or pain.  No abdominal pain.  No N/V/D.  The pain has been present for about 3 weeks and has not gone away with rest and advil.  Advil does help temporarily.  No radiating pain.  Some leg spasms occasionally.   No problems updated.  ALLERGIES: No Known Allergies  PAST MEDICAL HISTORY: Past Medical History:  Diagnosis Date   Graves disease     MEDICATIONS AT HOME: Prior to Admission medications   Medication Sig Start Date End Date Taking? Authorizing Provider  meloxicam (MOBIC) 15 MG tablet Take 1 tablet (15 mg total) by mouth daily. Prn pain 01/13/23  Yes Anders Simmonds, PA-C  methocarbamol (ROBAXIN) 500 MG tablet Take 2 tablets (1,000 mg total) by mouth every 8 (eight) hours as needed. 01/13/23  Yes Salina Stanfield M, PA-C    ROS: Neg HEENT Neg resp Neg cardiac Neg GI Neg GU Neg MS Neg psych Neg neuro  Objective:   Vitals:   01/13/23 1442  BP: 134/80  Pulse: (!) 57  SpO2: 95%  Weight: 184 lb 12.8 oz (83.8 kg)   Exam General appearance : Awake, alert, not in any distress. Speech Clear. Not toxic looking HEENT: Atraumatic and Normocephalic Neck: Supple, no JVD. No cervical lymphadenopathy.  Chest: Good air entry bilaterally, CTAB.  No rales/rhonchi/wheezing CVS: S1 S2 regular, no murmurs.  Low back- no spiny TTP.  There is paraspinus spasm in the B lumbar region.  Neg SLR B.  DTR=BLE Extremities: B/L Lower Ext shows no edema, both legs are warm to touch Neurology: Awake alert, and oriented X 3, CN II-XII intact, Non focal Skin: No Rash  Data Review Lab Results  Component Value  Date   HGBA1C 5.9 (H) 10/15/2022   HGBA1C 5.8 (H) 12/19/2020   HGBA1C 5.6 03/30/2019    Assessment & Plan   1. Back pain without sciatica (Primary) No red flags - meloxicam (MOBIC) 15 MG tablet; Take 1 tablet (15 mg total) by mouth daily. Prn pain  Dispense: 30 tablet; Refill: 2 - methocarbamol (ROBAXIN) 500 MG tablet; Take 2 tablets (1,000 mg total) by mouth every 8 (eight) hours as needed.  Dispense: 90 tablet; Refill: 0 - DG Lumbar Spine 2-3 Views; Future  2. Muscle cramps Electrolytes normal at recent labs - methocarbamol (ROBAXIN) 500 MG tablet; Take 2 tablets (1,000 mg total) by mouth every 8 (eight) hours as needed.  Dispense: 90 tablet; Refill: 0 - DG Lumbar Spine 2-3 Views; Future    Return in about 4 months (around 05/14/2023) for PCP for chronic conditions.  The patient was given clear instructions to go to ER or return to medical center if symptoms don't improve, worsen or new problems develop. The patient verbalized understanding. The patient was told to call to get lab results if they haven't heard anything in the next week.      Georgian Co, PA-C [facility] [place], [place] [phone]   01/13/2023, 2:58 PM

## 2023-01-13 NOTE — Progress Notes (Signed)
Regina # [phone]

## 2023-01-24 ENCOUNTER — Ambulatory Visit: Payer: Self-pay

## 2023-01-24 NOTE — Telephone Encounter (Signed)
2nd attempt, Interpreter Dois Davenport # [phone], Patient called, left VM to return the call to the office to speak to the NT.   Summary: lower back pain.   Pt stated she is still having lower back pain. Stated was seen in the office on 12/12 the medication that she was given is not helping her at all. Stated her pain is 5 out of 10. Provided pt with information for X-ray imaging as an order was placed.  Seeking clinical advice. Please return the pt call with a spanish interpreter.

## 2023-01-24 NOTE — Telephone Encounter (Signed)
Summary: lower back pain.   Pt stated she is still having lower back pain. Stated was seen in the office on 12/12 the medication that she was given is not helping her at all. Stated her pain is 5 out of 10. Provided pt with information for X-ray imaging as an order was placed.     Using Spanish interpreter [place] # [phone], left message to call back.

## 2023-01-24 NOTE — Telephone Encounter (Signed)
Third attempt to reach pt.Using Spanish interpreter [place], # [phone].Left message to call back.

## 2023-05-20 ENCOUNTER — Ambulatory Visit: Payer: Self-pay | Admitting: Internal Medicine

## 2023-12-06 ENCOUNTER — Telehealth: Payer: Self-pay

## 2023-12-06 NOTE — Telephone Encounter (Addendum)
 Telephoned patient using interpreter#[phone]. Left a voice message with B[facility]CP scheduling contact information.  Telephoned patient using interpreter, Francee Sprung. Left a voice message B[facility]CP 12/13/2023

## 2023-12-20 ENCOUNTER — Other Ambulatory Visit: Payer: Self-pay

## 2023-12-20 DIAGNOSIS — N6310 Unspecified lump in the right breast, unspecified quadrant: Secondary | ICD-10-CM

## 2024-02-23 ENCOUNTER — Ambulatory Visit
Admission: RE | Admit: 2024-02-23 | Discharge: 2024-02-23 | Disposition: A | Discharge status: Home / Self Care | Payer: Self-pay | Source: Ambulatory Visit | Attending: Obstetrics and Gynecology | Admitting: Obstetrics and Gynecology

## 2024-02-23 ENCOUNTER — Ambulatory Visit: Payer: Self-pay

## 2024-02-23 ENCOUNTER — Ambulatory Visit: Payer: Self-pay | Admitting: *Deleted

## 2024-02-23 VITALS — BP 130/75 | Ht 62.0 in | Wt 186.0 lb

## 2024-02-23 DIAGNOSIS — Z1239 Encounter for other screening for malignant neoplasm of breast: Secondary | ICD-10-CM

## 2024-02-23 DIAGNOSIS — Z1211 Encounter for screening for malignant neoplasm of colon: Secondary | ICD-10-CM

## 2024-02-23 DIAGNOSIS — N6310 Unspecified lump in the right breast, unspecified quadrant: Secondary | ICD-10-CM

## 2024-02-23 NOTE — Progress Notes (Signed)
 [name] Regina Calhoun is a 54 y.o. female who presents to [facility] clinic today with no complaints. Patient had a diagnostic mammogram completed 12/23/2022 that was probably benign with a bilateral diagnostic mammogram recommended in 1 year.    Pap Smear: Pap smear not completed today. Last Pap smear was 10/15/2022 at [facility] and was normal with negative HPV. Per patient has no history of an abnormal Pap smear. Last Pap smear result is available in Epic.   Physical exam: Breasts Breasts symmetrical. No skin abnormalities bilateral breasts. No nipple retraction bilateral breasts. No nipple discharge bilateral breasts. No lymphadenopathy. No lumps palpated bilateral breasts. Complaints of bilateral outer breast tenderness on exam.      MS 3D DIAG MAMMO BILAT BR (aka MM) Result Date: 12/23/2022 CLINICAL DATA:  Short-term follow-up probably benign right breast mass. EXAM: DIGITAL DIAGNOSTIC BILATERAL MAMMOGRAM WITH TOMOSYNTHESIS AND CAD; ULTRASOUND RIGHT BREAST LIMITED TECHNIQUE: Bilateral digital diagnostic mammography and breast tomosynthesis was performed. The images were evaluated with computer-aided detection. ; Targeted ultrasound examination of the right breast was performed COMPARISON:  Previous exam(s). ACR Breast Density Category b: There are scattered areas of fibroglandular density. FINDINGS: Stable oval low-density mass central right breast. No new masses, calcifications or nonsurgical distortion identified within either breast. Targeted ultrasound is performed, showing a stable 3 x 2 x 2 mm probable complicated cyst right breast 12 o'clock position 1 cm from the nipple. IMPRESSION: Stable probably benign right breast mass. RECOMMENDATION: Bilateral diagnostic mammography with right breast ultrasound in 12 months to reassess probably benign right breast mass. I have discussed the findings and recommendations with the patient. If applicable, a reminder  letter will be sent to the patient regarding the next appointment. BI-RADS CATEGORY  3: Probably benign. Electronically Signed   By: Bard Moats M.D.   On: 12/23/2022 11:20   MM DIAG BREAST TOMO UNI RIGHT Result Date: 07/09/2022 CLINICAL DATA:  Short-term follow-up for a likely benign right breast mass. EXAM: DIGITAL DIAGNOSTIC UNILATERAL RIGHT MAMMOGRAM WITH TOMOSYNTHESIS; ULTRASOUND RIGHT BREAST LIMITED TECHNIQUE: Right digital diagnostic mammography and breast tomosynthesis was performed.; Targeted ultrasound examination of the right breast was performed COMPARISON:  Previous exam(s). ACR Breast Density Category a: The breasts are almost entirely fatty. FINDINGS: Spot compression tomosynthesis images through the central right breast demonstrates a stable bilobed oval mass. Ultrasound targeted to the right breast at 12 o'clock, 1 cm from the nipple demonstrates a stable near anechoic oval mass measuring 3 x 2 x 3 mm, previously measuring 3 x 2 x 3 mm. IMPRESSION: 1.  Stable likely benign right breast mass. RECOMMENDATION: Bilateral diagnostic mammogram and right breast ultrasound in November of 2024. I have discussed the findings and recommendations with the patient. If applicable, a reminder letter will be sent to the patient regarding the next appointment. BI-RADS CATEGORY  3: Probably benign. Electronically Signed   By: Rosaline Collet M.D.   On: 07/09/2022 15:12  MS DIGITAL DIAG TOMO UNI RIGHT Result Date: 01/06/2022 CLINICAL DATA:  54 year old female recalled from screening mammogram dated 12/10/2021 for a possible right breast mass. EXAM: DIGITAL DIAGNOSTIC UNILATERAL RIGHT MAMMOGRAM WITH TOMOSYNTHESIS; ULTRASOUND RIGHT BREAST LIMITED TECHNIQUE: Right digital diagnostic mammography and breast tomosynthesis was performed.; Targeted ultrasound examination of the right breast was performed COMPARISON:  Previous exam(s). ACR Breast Density Category b: There are scattered areas of fibroglandular density.  FINDINGS: There is a persistent oval, circumscribed equal density mass in the subareolar right breast. Targeted ultrasound  is performed, showing an oval, circumscribed hypoechoic mass at the 12 o'clock position 1 cm from the nipple. It measures 3 x 3 x 2 mm. This likely corresponds with the mammographic finding. IMPRESSION: Probably benign right breast mass corresponding with the screening mammographic findings. RECOMMENDATION: Diagnostic right breast mammogram and ultrasound in 6 months. I have discussed the findings and recommendations with the patient. If applicable, a reminder letter will be sent to the patient regarding the next appointment. BI-RADS CATEGORY  3: Probably benign. Electronically Signed   By: Serena  Chacko M.D.   On: 01/06/2022 15:06  MS DIGITAL SCREENING TOMO BILATERAL Result Date: 12/14/2021 CLINICAL DATA:  Screening. EXAM: DIGITAL SCREENING BILATERAL MAMMOGRAM WITH TOMOSYNTHESIS AND CAD TECHNIQUE: Bilateral screening digital craniocaudal and mediolateral oblique mammograms were obtained. Bilateral screening digital breast tomosynthesis was performed. The images were evaluated with computer-aided detection. COMPARISON:  Previous exam(s). ACR Breast Density Category b: There are scattered areas of fibroglandular density. FINDINGS: In the right breast, a possible mass warrants further evaluation. In the left breast, no findings suspicious for malignancy. IMPRESSION: Further evaluation is suggested for a possible mass in the right breast. RECOMMENDATION: Diagnostic mammogram and possibly ultrasound of the right breast. (Code:FI-R-61M) The patient will be contacted regarding the findings, and additional imaging will be scheduled. BI-RADS CATEGORY  0: Incomplete. Need additional imaging evaluation and/or prior mammograms for comparison. Electronically Signed   By: Alm Pouch III M.D.   On: 12/14/2021 13:30   MS DIGITAL DIAG TOMO BILAT Result Date: 07/24/2020 CLINICAL DATA:  54 year old  female presenting for evaluation of intermittent focal burning pain in the left breast for several months. EXAM: DIGITAL DIAGNOSTIC BILATERAL MAMMOGRAM WITH TOMOSYNTHESIS AND CAD; ULTRASOUND LEFT BREAST LIMITED TECHNIQUE: Bilateral digital diagnostic mammography and breast tomosynthesis was performed. The images were evaluated with computer-aided detection.; Targeted ultrasound examination of the left breast was performed COMPARISON:  Previous exam(s). ACR Breast Density Category b: There are scattered areas of fibroglandular density. FINDINGS: A BB has been placed along the lateral anterior aspect of the left breast indicating the focally tender site of concern. There are no suspicious mammographic findings deep to the marker. No suspicious calcifications, masses or areas of distortion are seen in the bilateral breasts. Mammographic images were processed with CAD. Ultrasound targeted to the left breast at 2 o'clock, 3 cm from the nipple, demonstrates normal fibroglandular tissue. No masses or suspicious areas of shadowing are identified. IMPRESSION: 1. There are no suspicious mammographic or targeted sonographic abnormalities at the tender site of concern in the left breast. 2.  No mammographic evidence of malignancy in the bilateral breasts. RECOMMENDATION: 1. Clinical follow-up recommended for the tender area of concern in the lateral left breast. Any further workup should be based on clinical grounds. 2.  Screening mammogram in one year.(Code:SM-B-01Y) I have discussed the findings and recommendations with the patient. If applicable, a reminder letter will be sent to the patient regarding the next appointment. BI-RADS CATEGORY  1: Negative. Electronically Signed   By: Rosaline Collet M.D.   On: 07/24/2020 11:09   MS DIGITAL SCREENING TOMO BILATERAL Result Date: 06/14/2019 CLINICAL DATA:  Screening. EXAM: DIGITAL SCREENING BILATERAL MAMMOGRAM WITH TOMO AND CAD COMPARISON:  Previous exam(s). ACR Breast  Density Category b: There are scattered areas of fibroglandular density. FINDINGS: There are no findings suspicious for malignancy. Images were processed with CAD. IMPRESSION: No mammographic evidence of malignancy. A result letter of this screening mammogram will be mailed directly to the patient. RECOMMENDATION: Screening mammogram in  one year. (Code:SM-B-01Y) BI-RADS CATEGORY  1: Negative. Electronically Signed   By: Toribio Agreste M.D.   On: 06/14/2019 13:27    Pelvic/Bimanual Pap is not indicated today per BCCCP guidelines.   Smoking History: Patient has never smoked.   Patient Navigation: Patient education provided. Access to services provided for patient through [place] program. Spanish interpreter Bernice Angry from [facility] provided.    Colorectal Cancer Screening: Per patient has never had colonoscopy completed. No complaints today.    Breast and Cervical Cancer Risk Assessment: Patient does not have family history of breast cancer, known genetic mutations, or radiation treatment to the chest before age 58. Patient does not have history of cervical dysplasia, immunocompromised, or DES exposure in-utero.  Risk Scores as of Encounter on 02/23/2024     Regina Calhoun           5-year 0.93%   Lifetime 6.87%   This patient is [place] but has no documented birth country, so the [place] model used data from [place] patients to calculate their risk score. Document a birth country in the Demographics activity for a more accurate score.         Last calculated by Silas, Ansyi K, CMA on 02/23/2024 at  8:13 AM        A: BCCCP exam without pap smear Complaint of bilateral outer breast tenderness.  P: Referred patient to the [facility] of [facility] for a diagnostic mammogram per recommendation. Appointment scheduled Thursday, February 23, 2024 at 0940.  Driscilla Wanda SQUIBB, RN 02/23/2024 8:45 AM

## 2024-02-23 NOTE — Patient Instructions (Signed)
 Explained breast self awareness Regina Calhoun how to perform BSE and gave educational materials to take home. Patient did not need a Pap smear today due to last Pap smear and HPV typing was 10/15/2022. Let her know BCCCP will cover Pap smears and HPV typing every 5 years unless has a history of abnormal Pap smears. Referred patient to the [facility] for a diagnostic mammogram per recommendation. Appointment scheduled Thursday, February 23, 2024 at 0940. Patient aware of appointment and will be there. Regina Calhoun verbalized understanding.  Regina Calhoun, Wanda Ship, RN 8:45 AM

## 2024-03-09 ENCOUNTER — Other Ambulatory Visit: Payer: Self-pay

## 2024-03-09 ENCOUNTER — Ambulatory Visit: Payer: Self-pay | Admitting: Internal Medicine

## 2024-03-09 ENCOUNTER — Encounter: Payer: Self-pay | Admitting: Internal Medicine

## 2024-03-09 VITALS — BP 114/74 | HR 52 | Temp 98.2°F | Ht 62.0 in | Wt 186.0 lb

## 2024-03-09 DIAGNOSIS — R252 Cramp and spasm: Secondary | ICD-10-CM

## 2024-03-09 DIAGNOSIS — Z0000 Encounter for general adult medical examination without abnormal findings: Secondary | ICD-10-CM

## 2024-03-09 DIAGNOSIS — Z87898 Personal history of other specified conditions: Secondary | ICD-10-CM

## 2024-03-09 DIAGNOSIS — Z1211 Encounter for screening for malignant neoplasm of colon: Secondary | ICD-10-CM

## 2024-03-09 DIAGNOSIS — Z8639 Personal history of other endocrine, nutritional and metabolic disease: Secondary | ICD-10-CM

## 2024-03-09 DIAGNOSIS — E6609 Other obesity due to excess calories: Secondary | ICD-10-CM

## 2024-03-09 DIAGNOSIS — Z2821 Immunization not carried out because of patient refusal: Secondary | ICD-10-CM

## 2024-03-09 MED ORDER — METHOCARBAMOL 500 MG PO TABS
500.0000 mg | ORAL_TABLET | Freq: Every day | ORAL | 1 refills | Status: AC | PRN
  Filled 2024-03-09: qty 30, 30d supply, fill #0

## 2024-03-09 NOTE — Progress Notes (Signed)
 "   Patient ID: Regina Calhoun, female    DOB: 03/06/1970  MRN: [id number]  CC: Annual Exam (Physical/Intermittent bilat leg pain X2-3 mo/No to flu vax. Already has stool kit. )   Subjective: Regina Calhoun is a 54 y.o. female who presents for chronic ds management. Her chronic medical issues include:  Pt with hx of PreDM, obesity, HL  AMN Language interpreter used during this encounter. #[id number]   Discussed the use of AI scribe software for clinical note transcription with the patient, who gave verbal consent to proceed.  History of Present Illness Regina Calhoun Regina Calhoun is a 54 year old female who presents for an annual physical exam.  She has been experiencing intermittent leg cramps for the past two to three months, primarily occurring in the afternoon and at night. No pain or cramping is noted while walking. She is not on any new medications.  She has a past history of overactive thyroid  and requests to have her thyroid  checked during this visit. No palpitations, wgh loss or feeling hot all the time.  She reports a little blurry vision and mentions that her last eye exam was approximately two years ago.  She is obese for height and hx of preDM. She acknowledges poor eating habits and wants information on healthy eating habits.  HM: has FIT test at home. Declines flu and PCV vaccines. Advise to get hep B vaccine series through [facility].    Patient Active Problem List   Diagnosis Date Noted   Cervical polyp 09/14/2019   Elevated LDL cholesterol level 02/11/2017   Prediabetes 02/11/2017   Graves disease 12/10/2014   Breast pain in female 05/16/2012     Medications Ordered Prior to Encounter[1]  Allergies[2]  Social History   Socioeconomic History   Marital status: Married    Spouse name: Not on file   Number of children: 2   Years of education: Not on file   Highest education level: 11th grade  Occupational History   Not on file  Tobacco Use    Smoking status: Never   Smokeless tobacco: Never  Vaping Use   Vaping status: Never Used  Substance and Sexual Activity   Alcohol use: No    Alcohol/week: 0.0 standard drinks of alcohol   Drug use: No   Sexual activity: Not Currently    Birth control/protection: Post-menopausal  Other Topics Concern   Not on file  Social History Narrative   Not on file   Social Drivers of Health   Tobacco Use: Low Risk (02/23/2024)   Patient History    Smoking Tobacco Use: Never    Smokeless Tobacco Use: Never    Passive Exposure: Not on file  Financial Resource Strain: Low Risk (02/08/2024)   Received from [facility]   Overall Financial Resource Strain (CARDIA)    How hard is it for you to pay for the very basics like food, housing, medical care, and heating?: Not hard at all  Food Insecurity: No Food Insecurity (02/23/2024)   Epic    Worried About Radiation Protection Practitioner of Food in the Last Year: Never true    Ran Out of Food in the Last Year: Never true  Transportation Needs: No Transportation Needs (02/23/2024)   Epic    Lack of Transportation (Medical): No    Lack of Transportation (Non-Medical): No  Physical Activity: Not on file  Stress: Not on file  Social Connections: Not on file  Intimate Partner Violence: Not on file  Depression (PHQ2-9): Low Risk (10/15/2022)   Depression (PHQ2-9)    PHQ-2 Score: 0  Alcohol Screen: Not on file  Housing: Low Risk (02/08/2024)   [facility] from [facility]    In the last 12 months, was there a time when you were not able to pay the mortgage or rent on time?: No    In the past 12 months, how many times have you moved where you were living?: 0    At any time in the past 12 months, were you homeless or living in a shelter (including now)?: No  Utilities: Not At Risk (02/08/2024)   Received from [facility]    In the past 12 months has the electric, gas, oil, or water company threatened to shut off services in your home?: No  Health Literacy:  Not on file    Family History  Problem Relation Age of Onset   Hypothyroidism Mother    Polycystic ovary syndrome Daughter    Diabetes Maternal Grandmother    Clotting disorder Paternal Grandmother    Breast cancer Neg Hx     Past Surgical History:  Procedure Laterality Date   NO PAST SURGERIES      ROS: Review of Systems  HENT:  Negative for congestion, dental problem, hearing loss, sore throat and trouble swallowing.   Eyes:        Last eye exam 2 yrs ago  Respiratory:  Negative for cough and shortness of breath.   Cardiovascular:  Negative for chest pain.  Gastrointestinal:  Negative for abdominal pain and blood in stool.       BM are regular.  Genitourinary:  Negative for difficulty urinating.  Musculoskeletal:  Negative for arthralgias.   Negative except as stated above  PHYSICAL EXAM: BP 114/74 (BP Location: Left Arm, Patient Position: Sitting, Cuff Size: Normal)   Pulse (!) 52   Temp 98.2 F (36.8 C) (Oral)   Ht 5' 2 (1.575 m)   Wt 186 lb (84.4 kg)   LMP 02/13/2018   SpO2 95%   BMI 34.02 kg/m   Wt Readings from Last 3 Encounters:  03/09/24 186 lb (84.4 kg)  02/23/24 186 lb (84.4 kg)  01/13/23 184 lb 12.8 oz (83.8 kg)    Physical Exam Constitutional: Appears well-developed middle age Hispanic female and well-nourished. No distress. Head: Normocephalic. Atraumatic Ears: External right and left ear normal.   Eyes: Conjunctivae and EOM are normal. PERRLA, no scleral icterus.  Mouth: no oral lesions, good oral hygiene, throat clear without exudates Neck: Neck supple.  No tracheal deviation. No thyromegaly. No cervical LN CVS: RRR, S1/S2 +, no murmurs, no gallops, no carotid bruit. No JVD Pulmonary: Effort and breath sounds normal, no stridor, rhonchi, wheezes, rales.  Abdominal: Soft. BS +,  no distension, tenderness, rebound or guarding. No organomegaly or masses Musculoskeletal: Normal range of motion. No edema and no tenderness.  Neuro: Alert and  oriented x3.  Cns grossly intact, Power: 5/5 BL in all 4s.  Reflexes normal.  Normal  muscle tone, coordination. . Skin: Skin is warm and dry. No rash noted. Not diaphoretic. No erythema. No pallor.  Psychiatric: Normal mood and affect. Behavior, judgment, thought content normal.  Ext: DP, PT and popliteal pulses 3+ BL      Latest Ref Rng & Units 10/15/2022   11:38 AM 12/19/2020   11:20 AM 03/30/2019    4:48 PM  CMP  Glucose 70 - 99 mg/dL 97  882  114   BUN 6 - 24 mg/dL 14  15  12    Creatinine 0.57 - 1.00 mg/dL 9.33  9.21  9.20   Sodium 134 - 144 mmol/L 141  142  143   Potassium 3.5 - 5.2 mmol/L 4.6  5.1  4.3   Chloride 96 - 106 mmol/L 105  105  107   CO2 20 - 29 mmol/L 22  22  20    Calcium 8.7 - 10.2 mg/dL 9.4  9.5  9.5   Total Protein 6.0 - 8.5 g/dL 7.3  7.2  7.3   Total Bilirubin 0.0 - 1.2 mg/dL 0.5  0.3  0.3   Alkaline Phos 44 - 121 IU/L 107  126  101   AST 0 - 40 IU/L 22  23  23    ALT 0 - 32 IU/L 25  22  25     Lipid Panel     Component Value Date/Time   CHOL 203 (H) 10/15/2022 1138   TRIG 104 10/15/2022 1138   HDL 54 10/15/2022 1138   CHOLHDL 3.8 10/15/2022 1138   CHOLHDL 3.3 Ratio 11/23/2007 2101   VLDL 12 11/23/2007 2101   LDLCALC 130 (H) 10/15/2022 1138    CBC    Component Value Date/Time   WBC 6.6 10/15/2022 1138   WBC 7.7 05/05/2015 1526   RBC 5.07 10/15/2022 1138   RBC 4.86 05/05/2015 1526   HGB 14.8 10/15/2022 1138   HCT 44.6 10/15/2022 1138   PLT 286 10/15/2022 1138   MCV 88 10/15/2022 1138   MCH 29.2 10/15/2022 1138   MCH 28.8 05/05/2015 1526   MCHC 33.2 10/15/2022 1138   MCHC 33.6 05/05/2015 1526   RDW 12.6 10/15/2022 1138   LYMPHSABS 3,003 05/05/2015 1526   MONOABS 462 05/05/2015 1526   EOSABS 539 (H) 05/05/2015 1526   BASOSABS 77 05/05/2015 1526    ASSESSMENT AND PLAN: 1. Annual physical exam (Primary) Advised to get the hepatitis B vaccine series through the health department if she has not the series before. Advised to get routine dental  cleaning at least twice a year. Advised to get regular eye exam at least once every 2 years. She is up-to-date with mammogram and Pap smear.  2. Class 1 obesity due to excess calories without serious comorbidity with body mass index (BMI) of 34.0 to 34.9 in adult Patient advised to eliminate sugary drinks from the diet, cut back on portion sizes especially of white carbohydrates, eat more white lean meat like chicken turkey and seafood instead of beef or pork and incorporate fresh fruits and vegetables into the diet daily. Encouraged regular exercise at least 3 to 4 days a week for 30 minutes. - CBC - Comprehensive metabolic panel with GFR - Lipid panel - Hemoglobin A1c  3. History of prediabetes See #1 above - Hemoglobin A1c  4. Leg cramps She has good pulses in her legs.  I suspect muscle cramps.  Will give Robaxin  to use as needed.  Advised that the medication can cause drowsiness. - methocarbamol  (ROBAXIN ) 500 MG tablet; Take 1 tablet (500 mg total) by mouth daily as needed for muscle spasms.  Dispense: 30 tablet; Refill: 1  5. History of Graves' disease - TSH+T4F+T3Free  6. Screening for colon cancer Advise to use and turn in FIT kit that she received from [facility] program last mth  7. Influenza vaccination declined 8. Pneumococcal vaccination declined Recommended.  Patient declined.    Patient was given the opportunity to ask questions.  Patient verbalized  understanding of the plan and was able to repeat key elements of the plan.   This documentation was completed using Paediatric nurse.  Any transcriptional errors are unintentional.  No orders of the defined types were placed in this encounter.    Requested Prescriptions    No prescriptions requested or ordered in this encounter    No follow-ups on file.  Barnie Louder, MD, FACP     [1]  No current outpatient medications on file prior to visit.   No current facility-administered  medications on file prior to visit.  [2] No Known Allergies  "

## 2024-03-09 NOTE — Patient Instructions (Addendum)
 " VISIT SUMMARY: During your annual physical exam, we discussed your leg cramps, weight management, thyroid  health, and general health maintenance. You also requested information on healthy eating habits and mentioned experiencing some blurry vision.  YOUR PLAN: -LEG CRAMPS: Leg cramps are sudden, involuntary muscle contractions that can cause significant discomfort. You have been prescribed methocarbamol  to use as needed for muscle cramps. Please avoid driving or operating heavy machinery after taking this medication, as it may cause drowsiness.  -OBESITY, CLASS 1: Being slightly overweight can increase the risk of various health issues. We discussed healthy eating habits, including avoiding sugary drinks, consuming more water, eating fresh fruits and vegetables daily, and reducing the intake of white carbohydrates while opting for lean meats.  -HISTORY OF GRAVES' DISEASE: Graves' disease is an overactive thyroid  condition. We have ordered thyroid  function tests to check your thyroid  health.  -GENERAL HEALTH MAINTENANCE: You are due for a colon cancer screening. Please complete and return the stool test. You declined the pneumonia and influenza vaccines but are up to date with your Pap smear and mammogram. We recommend obtaining the hepatitis B vaccine series from the health department and scheduling an eye exam, as your last one was two years ago.  INSTRUCTIONS: Please follow up with the thyroid  function tests as ordered. Complete and return the stool test for colon cancer screening. Obtain the hepatitis B vaccine series from the health department and schedule an eye exam.    Contains text generated by [employer].    [place] financial corporation saludable: adultos Healthy Eating for Adults Una alimentacin saludable puede ayudarlo a barista y pharmacologist un peso saludable, reducir el riesgo de tener enfermedades crnicas y vivir una vida larga y productiva. Es importante que siga una modalidad de alimentacin  saludable. Sus necesidades nutricionales y calricas deben satisfacerse principalmente con distintos alimentos ricos en nutrientes. Consejos para seguir surveyor, minerals Lea las etiquetas de los alimentos Lea las etiquetas y elija las que digan lo siguiente: Productos reducidos en sodio o con bajo contenido de sodio. Jugos con 100 % jugo de fruta. Alimentos con bajo contenido de grasas saturadas (menos de 3 g por porcin) y alto contenido de grasas poliinsaturadas y mining engineer. Alimentos con cereales integrales, como trigo integral, trigo partido, arroz integral y arroz salvaje. Cereales integrales fortificados con cido flico. Esto se recomienda a las mujeres embarazadas o que desean quedar embarazadas. Lea las etiquetas y no coma ni beba lo siguiente: Alimentos o bebidas con azcar agregada. Estos incluyen los alimentos que contienen azcar moreno, endulzante a base de maz, jarabe de maz, dextrosa, fructosa, glucosa, jarabe de maz de alta fructosa, miel, azcar invertido, lactosa, jarabe de malta, maltosa, melaza, azcar sin refinar, sacarosa, trehalosa y azcar turbinado. Limite el consumo de azcar agregada a menos del 10 % del total de caloras diarias. No consuma ms que las siguientes cantidades de azcar agregada por da: 6 cucharaditas (25 g) para las mujeres. 9 cucharaditas (38 g) para los hombres. Los alimentos que contienen almidones y cereales refinados o procesados. Los productos de cereales refinados, como harina blanca, harina de maz desgerminada, pan blanco y arroz blanco. Al ir de compras Elija refrigerios ricos en nutrientes, como verduras, frutas enteras y frutos secos. Evite los refrigerios con alto contenido de caloras y international aid/development worker, como las papas fritas, los refrigerios frutales y los caramelos. Use alios y productos para untar a base de aceite con los publishing rights manager de grasas slidas como la [place], la [place], la crema agria o [place]  crema. Limite las salsas,  las mezclas y los productos instantneos preelaborados como el arroz saborizado, los fideos instantneos y las pastas listas para comer. Pruebe ms fuentes de protena vegetal, como tofu, tempeh, frijoles negros, edamame, lentejas, frutos secos y semillas. Explore planes de alimentacin como la dieta mediterrnea o la dieta vegetariana. Pruebe salsas cardiosaludables hechas con frijoles y grasas saludables, como hummus y guacamole. Las verduras van muy bien con ellas. Al cocinar Use aceite para designer, multimedia de grasas slidas como [place], margarina o [place]. En lugar de frer, trate de cocinar en el horno, en la plancha o en la parrilla, o hervir los alimentos. Retire la parte grasa de las carnes antes de cocinarlas. Cocine las verduras al vapor en agua o caldo. Planificacin de las comidas  En las comidas, imagine dividir su plato en cuartos: La mitad del plato tiene frutas y verduras. Un cuarto del plato tiene cereales integrales. Un cuarto del plato tiene protena, especialmente carnes [place], aves, huevos, tofu, frijoles o frutos secos. Incluya lcteos descremados en su dieta diaria. Estilo de vida Elija opciones saludables en todos los mbitos, como en el hogar, el [place], la [place], los restaurantes y [place]. Prepare los alimentos de un modo seguro: Lvese las manos despus de manipular carnes crudas. Donde prepare alimentos, mantenga las superficies limpias lavndolas regularmente con agua caliente y jabn. Mantenga las carnes crudas separadas de los alimentos que estn listos para comer como las frutas y las verduras. Cocine los frutos de mar, carnes, aves y huevos hasta alcanzar la temperatura recomendada. Consiga un termmetro para alimentos. Almacene los alimentos a temperaturas seguras. En general: Mantenga los alimentos fros a una temperatura de 40 F (4,4 C) o inferior. Mantenga los alimentos calientes a una temperatura de 140 F (60  C) o superior. Mantenga el congelador a una temperatura de 0 F (-17,8 C) o inferior. Los alimentos no son seguros para su consumo cuando han estado a una temperatura de entre 40 y 140 F (4.4 y 60 C) por ms de 2 horas. Qu alimentos debo comer? Frutas Propngase comer entre 1 y 2 tazas de frutas frescas, primary school teacher (en su jugo natural) o primary school teacher. Una taza de fruta equivale a 1 manzana pequea, 1 banana grande, 8 fresas grandes, 1 taza (237 g) de fruta enlatada,  taza (82 g) de fruta seca o 1 taza (240 ml) de jugo al 100 %. Verduras Propngase comer de 2 a 4 tazas de verduras frescas y congeladas cada da, incluyendo diferentes variedades y colores. Una taza de verduras equivale a 1 taza (91 g) de brcoli o coliflor, 2 zanahorias medianas, 2 tazas (150 g) de verduras de marriott crudas, 1 tomate grande, 1 pimiento morrn grande, 1 batata grande o 1 patata blanca mediana. Cereales Propngase comer el equivalente a entre 4 y 10 onzas de cereales integrales por futures trader. Algunos ejemplos de equivalentes a 1 onza de cereales son 1 rebanada de pan, 1 taza (40 g) de cereal listo para comer, 3 tazas (24 g) de palomitas de maz o  taza (93 g) de arroz cocido. Carnes y otras protenas Propngase comer el equivalente a entre 5 y 7  onzas de protena por futures trader. Algunos ejemplos de equivalentes a 1 onza de protenas incluyen 1 huevo,  oz de frutos secos (12 almendras, 24 pistachos o 7 mitades de nueces), 1/4 taza (90 g) de frijoles cocidos, 6 cucharadas (90 g) de hummus o 1 cucharada (16 g) de toys ''r'' us  de man. Un corte de carne o pescado del tamao de un mazo de cartas equivale aproximadamente a 3 a 4 onzas (85 g). De las protenas que consume cada semana, intente que al menos 8 onzas (227 g) sean frutos de mar. Esto equivale a unas 2 porciones por semana. Esto incluye salmn, trucha, arenque y anchoas. Lcteos [place] instruments a 3 tazas de lcteos descremados o con bajo  contenido de grasa cada da. Algunos ejemplos de equivalentes a 1 taza de lcteos son 1 taza (240 ml) de leche, 8 onzas (250 g) de yogur, 1 onzas (44 g) de queso natural o 1 taza (240 ml) de leche de soja fortificada. Grasas y aceites Propngase consumir alrededor de 5 cucharaditas (21 g) de grasas y acupuncturist. Elija grasas monoinsaturadas, como el aceite de canola y de oliva, la mayonesa hecha con aceite de [place] o de [place], [place], [place] de man y games developer de los frutos secos, o bien grasas poliinsaturadas, como el aceite de [place], maz y soja, nueces, piones, semillas de ssamo, semillas de girasol y semillas de lino. Bebidas Propngase beber 6 vasos de 8 onzas de warehouse manager. Limite el caf a entre 3 y 5 tazas de ocho onzas por futures trader. Limite el consumo de bebidas con cafena que tengan caloras agregadas, como los refrescos y las bebidas energizantes. Si bebe alcohol: Limite la cantidad que bebe a lo siguiente: De 0 a 1 medida al da si es [place]. De 0 a 2 medidas al da si es varn. Sepa cunta cantidad de alcohol hay en las bebidas que toma. En los [address], una medida es una botella de cerveza de 12 oz (355 ml), un vaso de vino de 5 oz (148 ml) o un vaso de una bebida alcohlica de alta graduacin de 1 oz (44 ml). Condimentos y otros alimentos Trate de no agregar demasiada sal a los alimentos. Trate de usar hierbas y especias en lugar de sal. Trate de no agregar azcar a los alimentos. Esta informacin se basa en las pautas de nutricin de los [place]. Para obtener ms informacin, visite [email or web address]. Las information systems manager. Es posible que necesite cantidades diferentes. Esta informacin no tiene theme park manager el consejo del mdico. Asegrese de hacerle al mdico cualquier pregunta que tenga. Document Revised: 11/17/2021 Document Reviewed: 11/17/2021 Elsevier Patient Education  2025 [employer]. "
